# Patient Record
Sex: Male | Born: 1985 | Race: White | Hispanic: No | Marital: Married | Smoking: Former smoker
Health system: Southern US, Community
[De-identification: ages and names within clinical notes are randomized; demographics above are authoritative.]

## PROBLEM LIST (undated history)

## (undated) DIAGNOSIS — W57XXXA Bitten or stung by nonvenomous insect and other nonvenomous arthropods, initial encounter: Secondary | ICD-10-CM

## (undated) DIAGNOSIS — F909 Attention-deficit hyperactivity disorder, unspecified type: Secondary | ICD-10-CM

## (undated) DIAGNOSIS — L723 Sebaceous cyst: Secondary | ICD-10-CM

## (undated) DIAGNOSIS — G4730 Sleep apnea, unspecified: Secondary | ICD-10-CM

---
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
## 2005-06-28 HISTORY — PX: KNEE ARTHROSCOPY: SUR90

## 2005-12-08 ENCOUNTER — Emergency Department (HOSPITAL_COMMUNITY): Admission: EM | Admit: 2005-12-08 | Discharge: 2005-12-08 | Payer: Self-pay | Admitting: Emergency Medicine

## 2005-12-24 ENCOUNTER — Ambulatory Visit (HOSPITAL_BASED_OUTPATIENT_CLINIC_OR_DEPARTMENT_OTHER): Admission: RE | Admit: 2005-12-24 | Discharge: 2005-12-24 | Payer: Self-pay | Admitting: Orthopedic Surgery

## 2006-09-15 ENCOUNTER — Emergency Department (HOSPITAL_COMMUNITY): Admission: EM | Admit: 2006-09-15 | Discharge: 2006-09-15 | Payer: Self-pay | Admitting: Emergency Medicine

## 2011-12-15 ENCOUNTER — Inpatient Hospital Stay: Payer: Self-pay | Admitting: Internal Medicine

## 2011-12-15 LAB — CBC
HCT: 42 % (ref 40.0–52.0)
HGB: 14.1 g/dL (ref 13.0–18.0)
MCH: 29.7 pg (ref 26.0–34.0)
MCV: 89 fL (ref 80–100)
Platelet: 146 10*3/uL — ABNORMAL LOW (ref 150–440)
RDW: 12.9 % (ref 11.5–14.5)
WBC: 7.9 10*3/uL (ref 3.8–10.6)

## 2011-12-15 LAB — COMPREHENSIVE METABOLIC PANEL
Albumin: 3.3 g/dL — ABNORMAL LOW (ref 3.4–5.0)
Alkaline Phosphatase: 61 U/L (ref 50–136)
Anion Gap: 5 — ABNORMAL LOW (ref 7–16)
BUN: 16 mg/dL (ref 7–18)
Calcium, Total: 9.1 mg/dL (ref 8.5–10.1)
Co2: 30 mmol/L (ref 21–32)
Creatinine: 1.31 mg/dL — ABNORMAL HIGH (ref 0.60–1.30)
EGFR (Non-African Amer.): >60
Glucose: 96 mg/dL (ref 65–99)
Osmolality: 279 (ref 275–301)
Potassium: 3.8 mmol/L (ref 3.5–5.1)
SGOT(AST): 24 U/L (ref 15–37)
SGPT (ALT): 22 U/L
Total Protein: 6.9 g/dL (ref 6.4–8.2)

## 2011-12-15 LAB — DRUG SCREEN, URINE
Amphetamines, Ur Screen: NEGATIVE (ref ?–1000)
Barbiturates, Ur Screen: NEGATIVE (ref ?–200)
Benzodiazepine, Ur Scrn: NEGATIVE (ref ?–200)
Cocaine Metabolite,Ur ~~LOC~~: NEGATIVE (ref ?–300)
MDMA (Ecstasy)Ur Screen: NEGATIVE (ref ?–500)
Opiate, Ur Screen: NEGATIVE (ref ?–300)
Phencyclidine (PCP) Ur S: NEGATIVE (ref ?–25)

## 2011-12-15 LAB — URINALYSIS, COMPLETE
Bacteria: NONE SEEN
Bilirubin,UR: NEGATIVE
Ketone: NEGATIVE
Ph: 6 (ref 4.5–8.0)
RBC,UR: NONE SEEN /HPF (ref 0–5)
Specific Gravity: 1.02 (ref 1.003–1.030)
Squamous Epithelial: <1
WBC UR: 1 /HPF (ref 0–5)

## 2011-12-15 LAB — CK TOTAL AND CKMB (NOT AT ARMC)
CK-MB: 10.9 ng/mL — ABNORMAL HIGH (ref 0.5–3.6)
CK-MB: 27.5 ng/mL — ABNORMAL HIGH (ref 0.5–3.6)

## 2011-12-15 LAB — SEDIMENTATION RATE: Erythrocyte Sed Rate: 19 mm/hr — ABNORMAL HIGH (ref 0–15)

## 2011-12-15 LAB — LIPASE, BLOOD: Lipase: 97 U/L (ref 73–393)

## 2011-12-15 LAB — TROPONIN I
Troponin-I: 2.3 ng/mL — ABNORMAL HIGH
Troponin-I: 6.6 ng/mL — ABNORMAL HIGH

## 2011-12-16 LAB — BASIC METABOLIC PANEL
Calcium, Total: 8.6 mg/dL (ref 8.5–10.1)
Chloride: 105 mmol/L (ref 98–107)
Co2: 31 mmol/L (ref 21–32)
Creatinine: 1.22 mg/dL (ref 0.60–1.30)
Glucose: 109 mg/dL — ABNORMAL HIGH (ref 65–99)
Potassium: 4.1 mmol/L (ref 3.5–5.1)

## 2011-12-16 LAB — LIPID PANEL
Cholesterol: 109 mg/dL (ref 0–200)
HDL Cholesterol: 22 mg/dL — ABNORMAL LOW (ref 40–60)
Triglycerides: 107 mg/dL (ref 0–200)
VLDL Cholesterol, Calc: 21 mg/dL (ref 5–40)

## 2011-12-16 LAB — CBC WITH DIFFERENTIAL/PLATELET
Basophil #: 0 10*3/uL (ref 0.0–0.1)
Basophil %: 0.5 %
Eosinophil #: 0.2 10*3/uL (ref 0.0–0.7)
HCT: 38.6 % — ABNORMAL LOW (ref 40.0–52.0)
HGB: 12.6 g/dL — ABNORMAL LOW (ref 13.0–18.0)
Lymphocyte #: 2.6 10*3/uL (ref 1.0–3.6)
Lymphocyte %: 36.6 %
MCV: 89 fL (ref 80–100)
Monocyte %: 9.3 %
Neutrophil #: 3.6 10*3/uL (ref 1.4–6.5)
Platelet: 141 10*3/uL — ABNORMAL LOW (ref 150–440)
RBC: 4.33 10*6/uL — ABNORMAL LOW (ref 4.40–5.90)
WBC: 7.1 10*3/uL (ref 3.8–10.6)

## 2011-12-16 LAB — TROPONIN I: Troponin-I: 5.14 ng/mL — ABNORMAL HIGH

## 2011-12-16 LAB — CK TOTAL AND CKMB (NOT AT ARMC)
CK, Total: 200 U/L (ref 35–232)
CK-MB: 22.8 ng/mL — ABNORMAL HIGH (ref 0.5–3.6)

## 2012-01-27 ENCOUNTER — Encounter (INDEPENDENT_AMBULATORY_CARE_PROVIDER_SITE_OTHER): Payer: Self-pay | Admitting: General Surgery

## 2012-01-27 ENCOUNTER — Ambulatory Visit (INDEPENDENT_AMBULATORY_CARE_PROVIDER_SITE_OTHER): Payer: 59 | Admitting: General Surgery

## 2012-01-27 VITALS — BP 134/84 | HR 102 | Temp 98.4°F | Ht 75.0 in | Wt 304.8 lb

## 2012-01-27 DIAGNOSIS — L7211 Pilar cyst: Secondary | ICD-10-CM

## 2012-01-27 NOTE — Progress Notes (Signed)
Patient ID: Alfred Martinez, male   DOB: [birth year], 26 y.o.   MRN: [id number]  Chief Complaint  Patient presents with  . Pre-op Exam    eval seb cyst on back    HPI Alfred Martinez is a 26 y.o. male presents for evaluation of cyst on head. Previously evaluated 2 years ago, recommendation of surveillance for benign cyst. Has been present on left frontal scalp >6 years, very slow growth in size over that time. Has also noted a smaller similar lesion on left occipital area within the hair. He desires removal for mainly cosmetic reasons.   Denies any trauma, pain, redness, fluctuation in size, fevers, skin irritation.   HPI  History reviewed. No pertinent past medical history.  Past Surgical History  Procedure Date  . Joint replacement 12/2005    left knee    History reviewed. No pertinent family history.  Social History History  Substance Use Topics  . Smoking status: Never Smoker   . Smokeless tobacco: Not on file  . Alcohol Use: Yes     1-2x week    No Known Allergies  Current Outpatient Prescriptions  Medication Sig Dispense Refill  . amphetamine-dextroamphetamine (ADDERALL) 10 MG tablet         Review of Systems Review of Systems All other review of systems negative or noncontributory except as stated in the HPI  Blood pressure 134/84, pulse 102, temperature 98.4 F (36.9 C), temperature source Temporal, height 6\' 3"  (1.905 m), weight 304 lb 12.8 oz (138.256 kg), SpO2 98.00%.  Physical Exam Physical Exam Physical Exam  Vitals reviewed. Constitutional: He is oriented to person, place, and time. He appears well-developed and well-nourished. No distress.  HENT:  Head: Normocephalic and atraumatic. 2 cmm mobile cyst in left frontal-parietal region and small 4mm cyst on right posterior scalp. Mouth/Throat: No oropharyngeal exudate.  Eyes: Conjunctivae and EOM are normal. Pupils are equal, round, and reactive to light. Right eye exhibits no discharge. Left eye exhibits  no discharge. No scleral icterus.  Neck: Normal range of motion. No tracheal deviation present.  Cardiovascular: Normal rate, regular rhythm and normal heart sounds.   Pulmonary/Chest: Effort normal and breath sounds normal. No stridor. No respiratory distress. He has no wheezes. He has no rales. He exhibits no tenderness.  Abdominal: Soft. Bowel sounds are normal. He exhibits no distension and no mass. There is no tenderness. There is no rebound and no guarding.  Musculoskeletal: Normal range of motion. He exhibits no edema and no tenderness.  Neurological: He is alert and oriented to person, place, and time.  Skin: Skin is warm and dry. No rash noted. He is not diaphoretic. No erythema. No pallor.  Psychiatric: He has a normal mood and affect. His behavior is normal. Judgment and thought content normal.    Data Reviewed   Assessment    This is likely a benign pilar cyst This is most likely benign and has not really cause any symptoms or shown any significant growth. We discussed the risks and benefits of removal versus observation. He would like to think about this a lot longer and he can call me back if he would like to schedule this to be removed     Plan    Discussed with patient options including continued surveillance and cyst removal. This cyst removal procedure would be planned in OR, preferably under anesthesia due to risks of bleeding associated with the scalp. Discussed risks and benefits including bleeding, infection, recurrence, damage to surrounding structures. Patient desires  to discuss options and scheduling with his family and call if he desires to proceed.        Lodema Pilot DAVID 01/27/2012, 1:58 PM

## 2013-07-10 IMAGING — CT CT CHEST W/ CM
1 series · 15 of 33 positions shown, 19 images · IV contrast (APPLIED)
Comparison: None

REASON FOR EXAM: chest pain, elevated D-dimer
COMMENTS:

PROCEDURE:     CT  - CT CHEST (FOR PE) W  - December 15, 2011 [DATE]
RESULT:     Indications: Chest Pain
TECHNIQUE: A thin-section spiral CT from the lung apices to the upper
abdomen was acquired on a multi slice scanner following 100ml [id number]
intravenous contrast. These images were then transferred to the Siemens work
station and were subsequently reviewed utilizing 3-D reconstructions and MIP
images.

[Series 4: soft tissue · axial · 0.80mm/px · z∈[+596,+848]mm · 15 of 100 slices shown, 19 images]
[im 8/100  mediastinal]
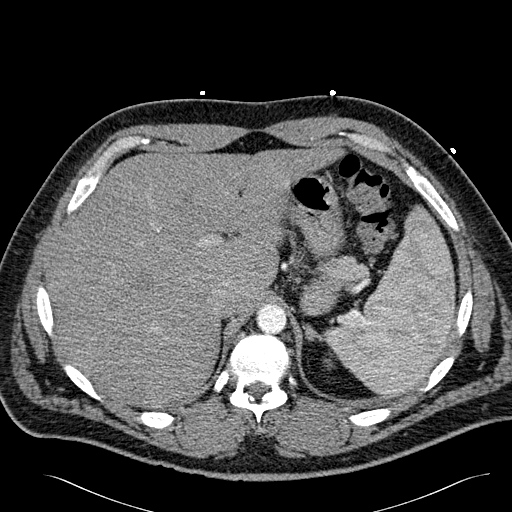
[im 8/100  lung]
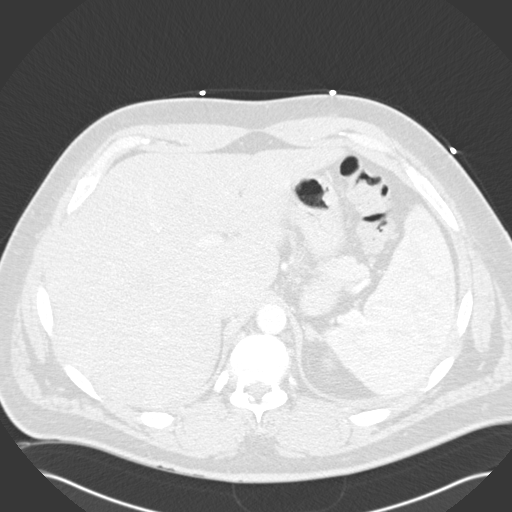
[im 15/100  lung]
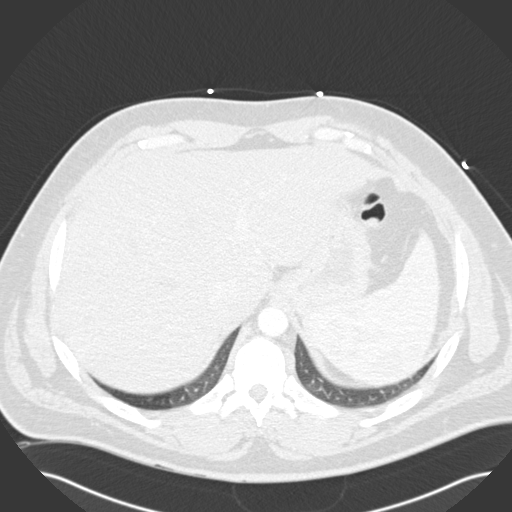
[im 20/100  lung]
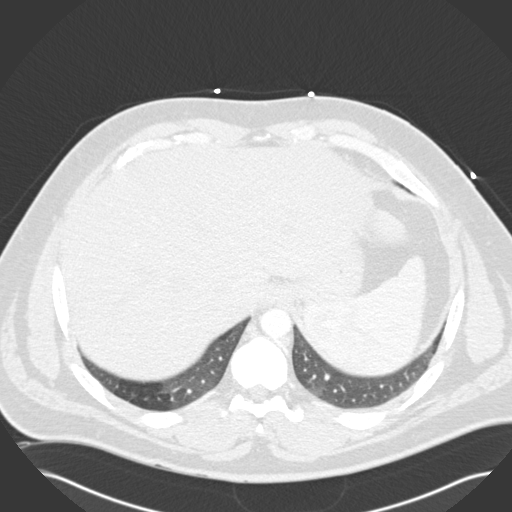
[im 26/100  lung]
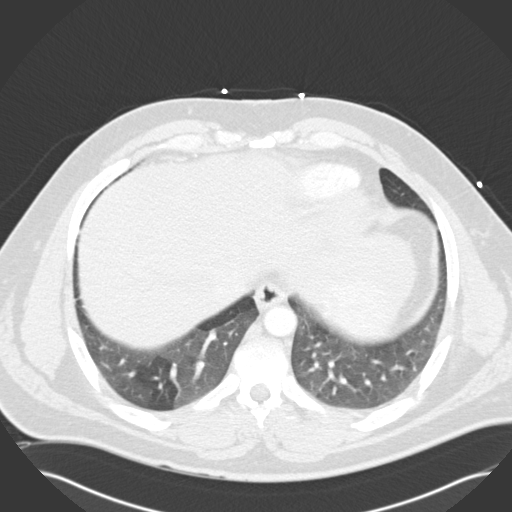
[im 34/100  mediastinal]
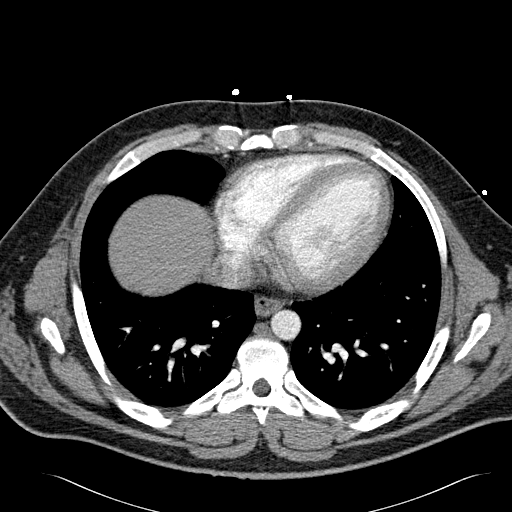
[im 34/100  lung]
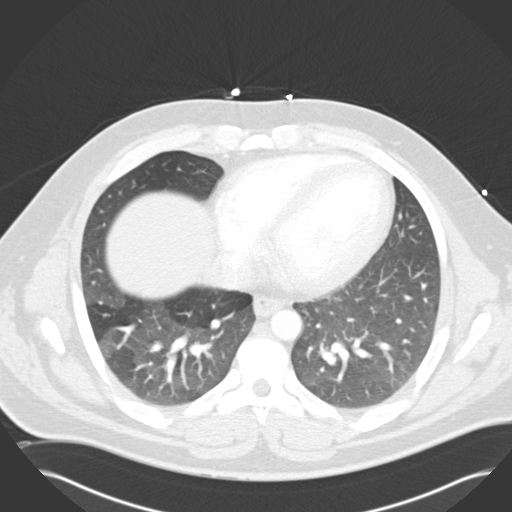
[im 40/100  lung]
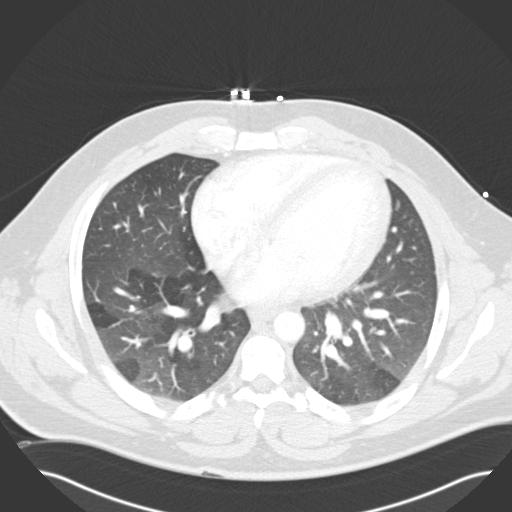
[im 45/100  lung]
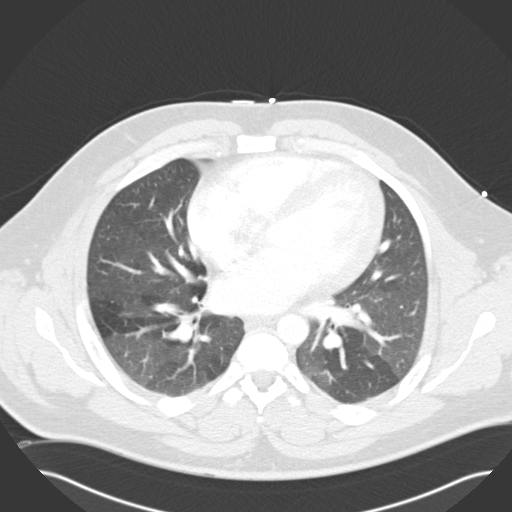
[im 52/100  lung]
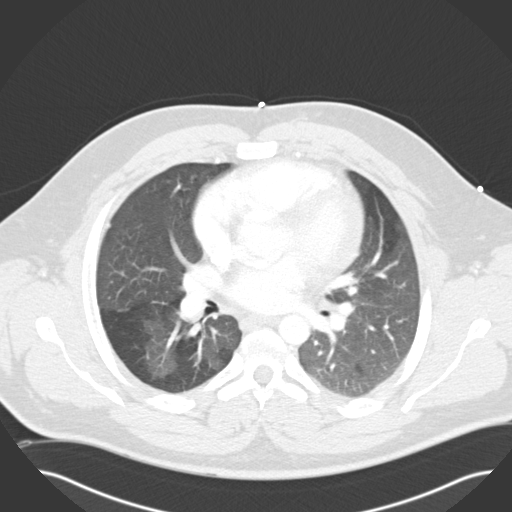
[im 56/100  mediastinal]
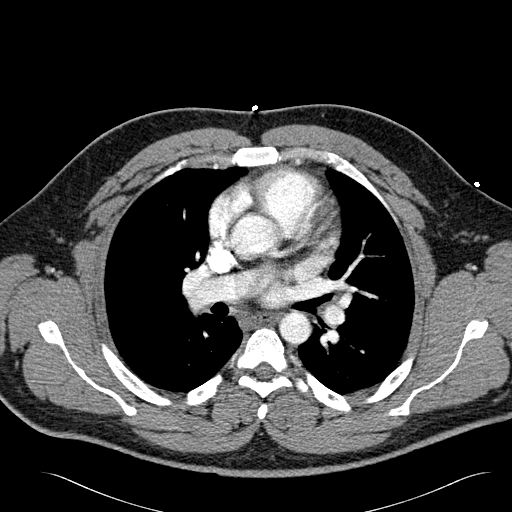
[im 56/100  lung]
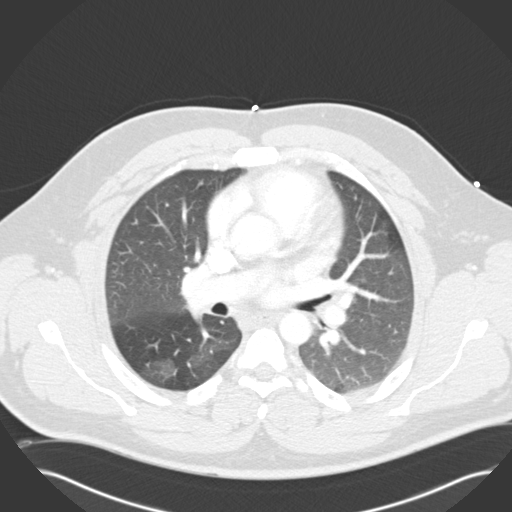
[im 60/100  lung]
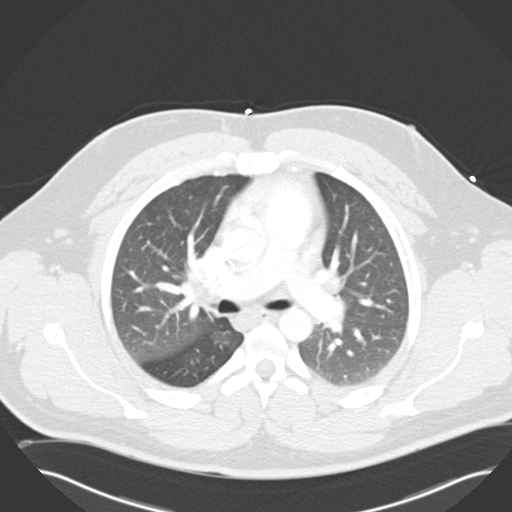
[im 67/100  lung]
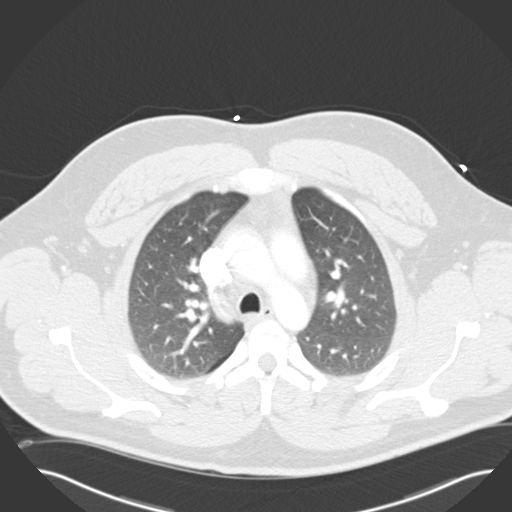
[im 74/100  lung]
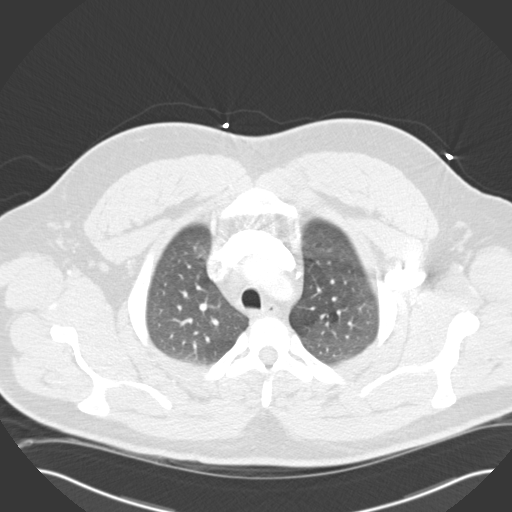
[im 80/100  mediastinal]
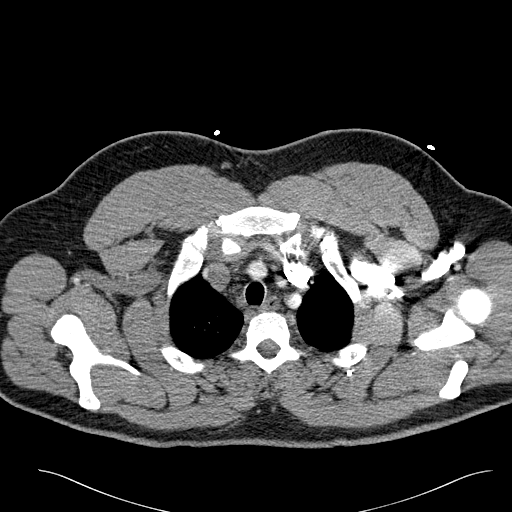
[im 80/100  lung]
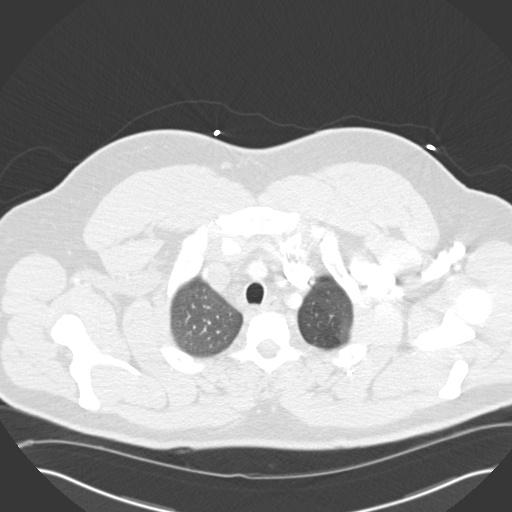
[im 85/100  lung]
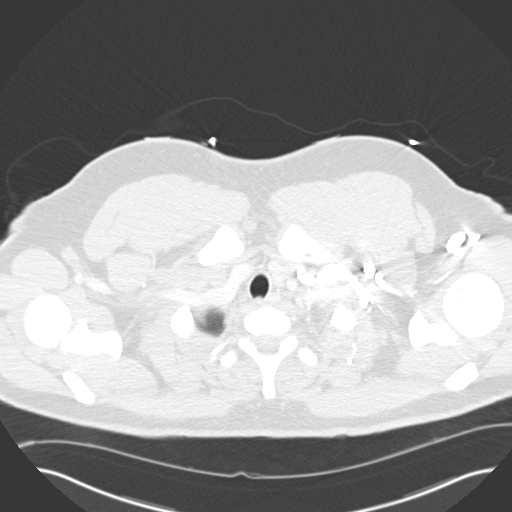
[im 92/100  lung]
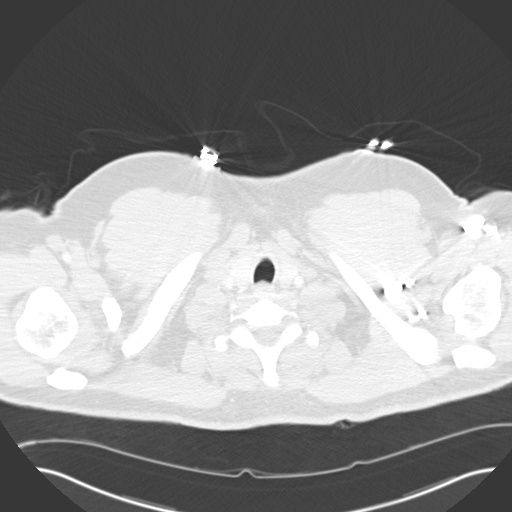

[15 of 33 positions shown; findings below may reference images not displayed]

FINDINGS: There is adequate opacification of the central pulmonary arteries. The
segmental and subsegmental branches are suboptimally opacified limiting
evaluation. There is no central pulmonary embolus. The main pulmonary
artery, right main pulmonary artery, and left main pulmonary arteries are
normal in size. The heart size is normal. There is no pericardial effusion.

The lungs are clear. There are areas of air-trapping in the lower lobes
bilaterally. There is no focal consolidation, pleural effusion, or
pneumothorax.

There is no axillary, hilar, or mediastinal adenopathy.

The osseous structures are unremarkable.

The visualized portions of the upper abdomen are unremarkable.
IMPRESSION: 1. There is no central pulmonary embolus. The segmental and subsegmental
branches are suboptimally opacified for evaluation.

2. There are patchy areas of air trapping in bilateral lower lobes.

[REDACTED]

## 2013-08-20 ENCOUNTER — Ambulatory Visit: Payer: BC Managed Care – PPO | Admitting: Family Medicine

## 2013-08-20 VITALS — BP 128/86 | HR 85 | Temp 98.0°F | Resp 16 | Ht 75.0 in | Wt 363.0 lb

## 2013-08-20 DIAGNOSIS — R1013 Epigastric pain: Secondary | ICD-10-CM

## 2013-08-20 DIAGNOSIS — K5289 Other specified noninfective gastroenteritis and colitis: Secondary | ICD-10-CM

## 2013-08-20 DIAGNOSIS — R197 Diarrhea, unspecified: Secondary | ICD-10-CM

## 2013-08-20 LAB — POCT CBC
Granulocyte percent: 59 % (ref 37–80)
HCT, POC: 45.2 % (ref 43.5–53.7)
Hemoglobin: 14.6 g/dL (ref 14.1–18.1)
Lymph, poc: 2.3 (ref 0.6–3.4)
MCH, POC: 28.9 pg (ref 27–31.2)
MCHC: 32.3 g/dL (ref 31.8–35.4)
MCV: 89.3 fL (ref 80–97)
MID (cbc): 0.5 (ref 0–0.9)
MPV: 8 fL (ref 0–99.8)
POC Granulocyte: 4 (ref 2–6.9)
POC LYMPH PERCENT: 33.6 %L (ref 10–50)
POC MID %: 7.4 % (ref 0–12)
Platelet Count, POC: 215 10*3/uL (ref 142–424)
RBC: 5.06 M/uL (ref 4.69–6.13)
RDW, POC: 12.8 %
WBC: 6.8 10*3/uL (ref 4.6–10.2)

## 2013-08-20 LAB — COMPREHENSIVE METABOLIC PANEL
ALT: 32 U/L (ref 0–53)
AST: 16 U/L (ref 0–37)
Albumin: 4.1 g/dL (ref 3.5–5.2)
BUN: 14 mg/dL (ref 6–23)
CO2: 24 mEq/L (ref 19–32)
Calcium: 8.7 mg/dL (ref 8.4–10.5)
Chloride: 105 mEq/L (ref 96–112)
Creat: 0.88 mg/dL (ref 0.50–1.35)
Glucose, Bld: 95 mg/dL (ref 70–99)
Sodium: 138 mEq/L (ref 135–145)
Total Bilirubin: 0.4 mg/dL (ref 0.2–1.2)
Total Protein: 6.1 g/dL (ref 6.0–8.3)

## 2013-08-20 LAB — COMPREHENSIVE METABOLIC PANEL WITH GFR
Alkaline Phosphatase: 54 U/L (ref 39–117)
Potassium: 4.1 meq/L (ref 3.5–5.3)

## 2013-08-20 NOTE — Progress Notes (Signed)
Chief Complaint:  Chief Complaint  Patient presents with  . Diarrhea    1 week  . Gastrophageal Reflux  . Nausea    HPI: Alfred Martinez is a 28 y.o. male who is here for 1 week history of nonbloody diarrhea, yesterday he had 9 episodes, he did not eat a whole lot. He has been drinking water and unsweetened tea, once every 30 min-1 hour. He denies nausea or vomiting; denies o fevers, chills, rashes. He has had a friend with a stomach virus. His friend got a shot and went away in a day or so. He does not remember eating out or anything different or going out or taking any new meds. He has not felt terrible, he has not had lots of enerygy. Still able to take in fluids. 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 History of GERD. He has not had nausea, he made himself throw up after trying otc meds. He denies abd pain, denies a history of constipation, does not have to strain.  He had a cold 2 weeks but not similar sxs. Just sinus congestion and cold. Has taken immodium and peptobsimol. He was getting bad indigestion so made himself vomit x 1.    No past medical history on file. Past Surgical History  Procedure Laterality Date  . Joint replacement  12/2005    left knee   History   Social History  . Marital Status: Single    Spouse Name: N/A    Number of Children: N/A  . Years of Education: N/A   Social History Main Topics  . Smoking status: Never Smoker   . Smokeless tobacco: None  . Alcohol Use: Yes     Comment: 1-2x week  . Drug Use: No  . Sexual Activity:    Other Topics Concern  . None   Social History Narrative  . None   No family history on file. No Known Allergies Prior to Admission medications   Medication Sig Start Date End Date Taking? Authorizing Provider  amphetamine-dextroamphetamine (ADDERALL) 10 MG tablet  01/25/12  Yes Historical Provider, MD     ROS: The patient denies fevers, chills,  night sweats, unintentional weight loss, chest pain, palpitations, wheezing, dyspnea on exertion, dysuria, hematuria, melena, numbness, weakness, or tingling.   All other systems have been reviewed and were otherwise negative with the exception of those mentioned in the HPI and as above.    PHYSICAL EXAM: Filed Vitals:   08/20/13 0916  BP: 128/86  Pulse: 85  Temp: 98 F (36.7 C)  Resp: 16   Filed Vitals:   08/20/13 0916  Height: 6\' 3"  (1.905 m)  Weight: 363 lb (164.656 kg)   Body mass index is 45.37 kg/(m^2).  General: Alert, no acute distress HEENT:  Normocephalic, atraumatic, oropharynx patent. EOMI, PERRLA, erythematous throat.  Cardiovascular:  Regular rate and rhythm, no rubs murmurs or gallops.  No Carotid bruits, radial pulse intact. No pedal edema.  Respiratory: Clear to auscultation bilaterally.  No wheezes, rales, or rhonchi.  No cyanosis, no use of accessory musculature GI: No organomegaly, abdomen is soft and non-tender, positive bowel sounds.  No masses. Skin: No rashes. Neurologic: Facial musculature symmetric. Psychiatric: Patient is appropriate throughout our interaction. Lymphatic: No cervical lymphadenopathy Musculoskeletal: Gait intact.   LABS: Results for orders placed in visit on 08/20/13  POCT CBC      Result Value Ref Range  WBC 6.8  4.6 - 10.2 K/uL   Lymph, poc 2.3  0.6 - 3.4   POC LYMPH PERCENT 33.6  10 - 50 %L   MID (cbc) 0.5  0 - 0.9   POC MID % 7.4  0 - 12 %M   POC Granulocyte 4.0  2 - 6.9   Granulocyte percent 59.0  37 - 80 %G   RBC 5.06  4.69 - 6.13 M/uL   Hemoglobin 14.6  14.1 - 18.1 g/dL   HCT, POC 40.9  81.1 - 53.7 %   MCV 89.3  80 - 97 fL   MCH, POC 28.9  27 - 31.2 pg   MCHC 32.3  31.8 - 35.4 g/dL   RDW, POC 91.4     Platelet Count, POC 215  142 - 424 K/uL   MPV 8.0  0 - 99.8 fL     EKG/XRAY:   Primary read interpreted by Dr. Conley Rolls at [facility].   ASSESSMENT/PLAN: Encounter Diagnoses  Name Primary?  . Abdominal pain, epigastric  Yes  . Diarrhea   . Other and unspecified noninfectious gastroenteritis and colitis(558.9)    Possible viral GI bug with GERD sxs PPI x BID Push fluids, BRAT diet CMP , h pylori, stool cx and op pending F/u prn   Gross sideeffects, risk and benefits, and alternatives of medications d/w patient. Patient is aware that all medications have potential sideeffects and we are unable to predict every sideeffect or drug-drug interaction that may occur.  Hamilton Capri PHUONG, DO 08/20/2013 10:33 AM

## 2013-08-20 NOTE — Patient Instructions (Signed)
Diet for Diarrhea, Adult  Frequent, runny stools (diarrhea) may be caused or worsened by food or drink. Diarrhea may be relieved by changing your diet. Since diarrhea can last up to 7 days, it is easy for you to lose too much fluid from the body and become dehydrated. Fluids that are lost need to be replaced. Along with a modified diet, make sure you drink enough fluids to keep your urine clear or pale yellow.  DIET INSTRUCTIONS  · Ensure adequate fluid intake (hydration): have 1 cup (8 oz) of fluid for each diarrhea episode. Avoid fluids that contain simple sugars or sports drinks, fruit juices, whole milk products, and sodas. Your urine should be clear or pale yellow if you are drinking enough fluids. Hydrate with an oral rehydration solution that you can purchase at pharmacies, retail stores, and online. You can prepare an oral rehydration solution at home by mixing the following ingredients together:  ·   tsp table salt.  · ¾ tsp baking soda.  ·  tsp salt substitute containing potassium chloride.  · 1  tablespoons sugar.  · 1 L (34 oz) of water.  · Certain foods and beverages may increase the speed at which food moves through the gastrointestinal (GI) tract. These foods and beverages should be avoided and include:  · Caffeinated and alcoholic beverages.  · High-fiber foods, such as raw fruits and vegetables, nuts, seeds, and whole grain breads and cereals.  · Foods and beverages sweetened with sugar alcohols, such as xylitol, sorbitol, and mannitol.  · Some foods may be well tolerated and may help thicken stool including:  · Starchy foods, such as rice, toast, pasta, low-sugar cereal, oatmeal, grits, baked potatoes, crackers, and bagels.    · Bananas.    · Applesauce.  · Add probiotic-rich foods to help increase healthy bacteria in the GI tract, such as yogurt and fermented milk products.  RECOMMENDED FOODS AND BEVERAGES  Starches  Choose foods with less than 2 g of fiber per serving.  · Recommended:  White,  French, and pita breads, plain rolls, buns, bagels. Plain muffins, matzo. Soda, saltine, or graham crackers. Pretzels, melba toast, zwieback. Cooked cereals made with water: cornmeal, farina, cream cereals. Dry cereals: refined corn, wheat, rice. Potatoes prepared any way without skins, refined macaroni, spaghetti, noodles, refined rice.  · Avoid:  Bread, rolls, or crackers made with whole wheat, multi-grains, rye, bran seeds, nuts, or coconut. Corn tortillas or taco shells. Cereals containing whole grains, multi-grains, bran, coconut, nuts, raisins. Cooked or dry oatmeal. Coarse wheat cereals, granola. Cereals advertised as "high-fiber." Potato skins. Whole grain pasta, wild or brown rice. Popcorn. Sweet potatoes, yams. Sweet rolls, doughnuts, waffles, pancakes, sweet breads.  Vegetables  · Recommended: Strained tomato and vegetable juices. Most well-cooked and canned vegetables without seeds. Fresh: Tender lettuce, cucumber without the skin, cabbage, spinach, bean sprouts.  · Avoid: Fresh, cooked, or canned: Artichokes, baked beans, beet greens, broccoli, Brussels sprouts, corn, kale, legumes, peas, sweet potatoes. Cooked: Green or red cabbage, spinach. Avoid large servings of any vegetables because vegetables shrink when cooked, and they contain more fiber per serving than fresh vegetables.  Fruit  · Recommended: Cooked or canned: Apricots, applesauce, cantaloupe, cherries, fruit cocktail, grapefruit, grapes, kiwi, mandarin oranges, peaches, pears, plums, watermelon. Fresh: Apples without skin, ripe banana, grapes, cantaloupe, cherries, grapefruit, peaches, oranges, plums. Keep servings limited to ½ cup or 1 piece.  · Avoid: Fresh: Apples with skin, apricots, mangoes, pears, raspberries, strawberries. Prune juice, stewed or dried prunes. Dried   fruits, raisins, dates. Large servings of all fresh fruits.  Protein  · Recommended: Ground or well-cooked tender beef, ham, veal, lamb, pork, or poultry. Eggs. Fish,  oysters, shrimp, lobster, other seafoods. Liver, organ meats.  · Avoid: Tough, fibrous meats with gristle. Peanut butter, smooth or chunky. Cheese, nuts, seeds, legumes, dried peas, beans, lentils.  Dairy  · Recommended: Yogurt, lactose-free milk, kefir, drinkable yogurt, buttermilk, soy milk, or plain hard cheese.  · Avoid: Milk, chocolate milk, beverages made with milk, such as milkshakes.  Soups  · Recommended: Bouillon, broth, or soups made from allowed foods. Any strained soup.  · Avoid: Soups made from vegetables that are not allowed, cream or milk-based soups.  Desserts and Sweets  · Recommended: Sugar-free gelatin, sugar-free frozen ice pops made without sugar alcohol.  · Avoid: Plain cakes and cookies, pie made with fruit, pudding, custard, cream pie. Gelatin, fruit, ice, sherbet, frozen ice pops. Ice cream, ice milk without nuts. Plain hard candy, honey, jelly, molasses, syrup, sugar, chocolate syrup, gumdrops, marshmallows.  Fats and Oils  · Recommended: Limit fats to less than 8 tsp per day.  · Avoid: Seeds, nuts, olives, avocados. Margarine, butter, cream, mayonnaise, salad oils, plain salad dressings. Plain gravy, crisp bacon without rind.  Beverages  · Recommended: Water, decaffeinated teas, oral rehydration solutions, sugar-free beverages not sweetened with sugar alcohols.  · Avoid: Fruit juices, caffeinated beverages (coffee, tea, soda), alcohol, sports drinks, or lemon-lime soda.  Condiments  · Recommended: Ketchup, mustard, horseradish, vinegar, cocoa powder. Spices in moderation: allspice, basil, bay leaves, celery powder or leaves, cinnamon, cumin powder, curry powder, ginger, mace, marjoram, onion or garlic powder, oregano, paprika, parsley flakes, ground pepper, rosemary, sage, savory, tarragon, thyme, turmeric.  · Avoid: Coconut, honey.  Document Released: 09/04/2003 Document Revised: 03/08/2012 Document Reviewed: 10/29/2011  [employer]® Patient Information ©2014 [employer].

## 2013-08-22 ENCOUNTER — Telehealth: Payer: Self-pay | Admitting: Family Medicine

## 2013-08-22 LAB — HELICOBACTER PYLORI  ANTIBODY, IGM: Helicobacter pylori, IgM: 0.7 U/mL (ref <9.0)

## 2013-08-22 NOTE — Telephone Encounter (Signed)
Spoke with patient about labs, he is feeling 100%. Did not give stool sample, told him it was ok to not give me any.

## 2013-11-02 ENCOUNTER — Encounter (INDEPENDENT_AMBULATORY_CARE_PROVIDER_SITE_OTHER): Payer: 59 | Admitting: Surgery

## 2013-11-07 ENCOUNTER — Encounter (INDEPENDENT_AMBULATORY_CARE_PROVIDER_SITE_OTHER): Payer: Self-pay | Admitting: General Surgery

## 2013-11-07 ENCOUNTER — Ambulatory Visit (INDEPENDENT_AMBULATORY_CARE_PROVIDER_SITE_OTHER): Payer: BC Managed Care – PPO | Admitting: General Surgery

## 2013-11-07 ENCOUNTER — Telehealth (INDEPENDENT_AMBULATORY_CARE_PROVIDER_SITE_OTHER): Payer: Self-pay | Admitting: General Surgery

## 2013-11-07 DIAGNOSIS — L723 Sebaceous cyst: Secondary | ICD-10-CM

## 2013-11-07 NOTE — Progress Notes (Signed)
Patient ID: Alfred Martinez, male   DOB: 05/26/1986, 28 y.o.   MRN: [id number]  Chief Complaint  Patient presents with  . New Evaluation    seba cyst on top and back of head     HPI Alfred Evahomas Sartin is a 28 y.o. male.  Chief complaint: Scalp sebaceous cyst HPI Patient was previously seen by Dr. Biagio QuintLayton regarding a sebaceous cyst on his scalp. This is a left side towards the front. He did not schedule surgery but, since that time, has changed his mind and presents for evaluation for removal. It has never become infected. It has never drained. In the interim, he is also found another small cyst in the posterior right scalp which he would like removed. History reviewed. No pertinent past medical history.  Past Surgical History  Procedure Laterality Date  . 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 No pertinent family history.  Social History History  Substance Use Topics  . Smoking status: Former Smoker    Quit date: 11/08/2002  . Smokeless tobacco: Not on file  . Alcohol Use: Yes     Comment: 1-2x week    No Known Allergies  Current Outpatient Prescriptions  Medication Sig Dispense Refill  . amphetamine-dextroamphetamine (ADDERALL) 10 MG tablet       . Cyanocobalamin (B-12) 100 MCG TABS Take 200 capsules by mouth.      . Multiple Vitamins-Minerals (ECHINACEA ACZ PO) Take by mouth.      . vitamin C (ASCORBIC ACID) 500 MG tablet Take 500 mg by mouth.       No current facility-administered medications for this visit.    Review of Systems Review of Systems  Constitutional: Negative for fever, chills and unexpected weight change.  HENT: Negative for congestion, hearing loss, sore throat, trouble swallowing and voice change.   Eyes: Negative for visual disturbance.  Respiratory: Negative for cough and wheezing.   Cardiovascular: Negative for chest pain, palpitations and leg swelling.  Gastrointestinal: Negative for nausea, vomiting, abdominal pain, diarrhea,  constipation, blood in stool, abdominal distention, anal bleeding and rectal pain.  Genitourinary: Negative for hematuria and difficulty urinating.  Musculoskeletal: Negative for arthralgias.  Skin: Negative for rash and wound.       Please refer to history of present illness  Neurological: Negative for seizures, syncope, weakness and headaches.  Hematological: Negative for adenopathy. Does not bruise/bleed easily.  Psychiatric/Behavioral: Negative for confusion.    There were no vitals taken for this visit.  Physical Exam Physical Exam  Constitutional: He is oriented to person, place, and time. He appears well-developed and well-nourished.  HENT:  Head:    2 cm sebaceous cyst left frontal scalp without evidence of infection, 8mm cyst right occipital scalp without evidence of infection  Eyes: EOM are normal. Pupils are equal, round, and reactive to light.  Neck: Normal range of motion. Neck supple. No tracheal deviation present.  Cardiovascular: Normal rate and normal heart sounds.   Pulmonary/Chest: Effort normal and breath sounds normal. No stridor. No respiratory distress. He has no wheezes.  Abdominal: Soft. Bowel sounds are normal. He exhibits no distension. There is no tenderness. There is no rebound.  Musculoskeletal: Normal range of motion.  Lymphadenopathy:    He has no cervical adenopathy.  Neurological: He is alert and oriented to person, place, and time.  Skin: Skin is warm and dry.     Assessment    Sebaceous cyst of scalp x2    Plan  I have offered removal as an outpatient surgical procedure. We will remove both cysts. Procedure, risks, benefits were discussed in detail the patient. He plans to schedule in the future. He is agreeable.       Liz MaladyBurke E Tyse Auriemma 11/07/2013, 12:21 PM

## 2013-11-07 NOTE — Addendum Note (Signed)
Addended by: Liz MaladyHOMPSON, Artelia Game E on: 11/07/2013 12:26 PM   Modules accepted: Orders

## 2013-11-07 NOTE — Telephone Encounter (Signed)
Patient met with surgery scheduling went over financial responsibilities, patient will call back to schedule. °

## 2013-11-26 ENCOUNTER — Encounter (HOSPITAL_BASED_OUTPATIENT_CLINIC_OR_DEPARTMENT_OTHER): Payer: Self-pay | Admitting: *Deleted

## 2013-11-26 DIAGNOSIS — L723 Sebaceous cyst: Secondary | ICD-10-CM

## 2013-11-26 DIAGNOSIS — W57XXXA Bitten or stung by nonvenomous insect and other nonvenomous arthropods, initial encounter: Secondary | ICD-10-CM

## 2013-11-26 HISTORY — DX: Bitten or stung by nonvenomous insect and other nonvenomous arthropods, initial encounter: W57.XXXA

## 2013-11-26 HISTORY — DX: Sebaceous cyst: L72.3

## 2013-11-26 NOTE — Pre-Procedure Instructions (Signed)
BMI and hx. of sleep apnea discussed with Dr. Jean Rosenthal; pt. OK to come for surgery.

## 2013-11-30 ENCOUNTER — Ambulatory Visit (HOSPITAL_BASED_OUTPATIENT_CLINIC_OR_DEPARTMENT_OTHER): Payer: BC Managed Care – PPO | Admitting: Certified Registered"

## 2013-11-30 ENCOUNTER — Encounter (HOSPITAL_BASED_OUTPATIENT_CLINIC_OR_DEPARTMENT_OTHER): Payer: BC Managed Care – PPO | Admitting: Certified Registered"

## 2013-11-30 ENCOUNTER — Encounter (HOSPITAL_BASED_OUTPATIENT_CLINIC_OR_DEPARTMENT_OTHER): Payer: Self-pay | Admitting: *Deleted

## 2013-11-30 ENCOUNTER — Encounter (HOSPITAL_BASED_OUTPATIENT_CLINIC_OR_DEPARTMENT_OTHER)
Admission: RE | Disposition: A | Discharge status: Home / Self Care | Payer: Self-pay | Source: Ambulatory Visit | Attending: General Surgery

## 2013-11-30 ENCOUNTER — Ambulatory Visit (HOSPITAL_BASED_OUTPATIENT_CLINIC_OR_DEPARTMENT_OTHER)
Admission: RE | Admit: 2013-11-30 | Discharge: 2013-11-30 | Disposition: A | Discharge status: Home / Self Care | Payer: BC Managed Care – PPO | Source: Ambulatory Visit | Attending: General Surgery | Admitting: General Surgery

## 2013-11-30 DIAGNOSIS — G4730 Sleep apnea, unspecified: Secondary | ICD-10-CM | POA: Insufficient documentation

## 2013-11-30 DIAGNOSIS — Z96659 Presence of unspecified artificial knee joint: Secondary | ICD-10-CM | POA: Insufficient documentation

## 2013-11-30 DIAGNOSIS — Z87891 Personal history of nicotine dependence: Secondary | ICD-10-CM | POA: Insufficient documentation

## 2013-11-30 DIAGNOSIS — Z79899 Other long term (current) drug therapy: Secondary | ICD-10-CM | POA: Insufficient documentation

## 2013-11-30 DIAGNOSIS — F909 Attention-deficit hyperactivity disorder, unspecified type: Secondary | ICD-10-CM | POA: Insufficient documentation

## 2013-11-30 DIAGNOSIS — L7212 Trichodermal cyst: Secondary | ICD-10-CM | POA: Insufficient documentation

## 2013-11-30 DIAGNOSIS — L723 Sebaceous cyst: Secondary | ICD-10-CM

## 2013-11-30 HISTORY — DX: Sleep apnea, unspecified: G47.30

## 2013-11-30 HISTORY — DX: Sebaceous cyst: L72.3

## 2013-11-30 HISTORY — DX: Bitten or stung by nonvenomous insect and other nonvenomous arthropods, initial encounter: W57.XXXA

## 2013-11-30 HISTORY — DX: Attention-deficit hyperactivity disorder, unspecified type: F90.9

## 2013-11-30 HISTORY — PX: EAR CYST EXCISION: SHX22

## 2013-11-30 LAB — POCT HEMOGLOBIN-HEMACUE: Hemoglobin: 14.4 g/dL (ref 13.0–17.0)

## 2013-11-30 SURGERY — CYST REMOVAL
Anesthesia: General | Site: Scalp

## 2013-11-30 MED ORDER — MIDAZOLAM HCL 2 MG/ML PO SYRP: 12.0000 mg | ORAL_SOLUTION | Freq: Once | ORAL | Status: DC | PRN

## 2013-11-30 MED ORDER — CHLORHEXIDINE GLUCONATE 4 % EX LIQD: 1.0000 "application " | Freq: Once | CUTANEOUS | Status: DC

## 2013-11-30 MED ORDER — HYDROMORPHONE HCL PF 1 MG/ML IJ SOLN
0.2500 mg | INTRAMUSCULAR | Status: DC | PRN
  Administered 2013-11-30: 0.25 mg via INTRAVENOUS
  Administered 2013-11-30: 0.5 mg via INTRAVENOUS

## 2013-11-30 MED ORDER — DEXAMETHASONE SODIUM PHOSPHATE 4 MG/ML IJ SOLN
INTRAMUSCULAR | Status: DC | PRN
  Administered 2013-11-30: 10 mg via INTRAVENOUS

## 2013-11-30 MED ORDER — OXYCODONE HCL 5 MG PO TABS: 5.0000 mg | ORAL_TABLET | ORAL | Status: AC | PRN

## 2013-11-30 MED ORDER — BUPIVACAINE HCL (PF) 0.25 % IJ SOLN
INTRAMUSCULAR | Status: AC
Start: 2013-11-30 — End: 2013-11-30
  Filled 2013-11-30: qty 30

## 2013-11-30 MED ORDER — FENTANYL CITRATE 0.05 MG/ML IJ SOLN: 50.0000 ug | INTRAMUSCULAR | Status: DC | PRN

## 2013-11-30 MED ORDER — MIDAZOLAM HCL 5 MG/5ML IJ SOLN
INTRAMUSCULAR | Status: DC | PRN
  Administered 2013-11-30: 2 mg via INTRAVENOUS

## 2013-11-30 MED ORDER — SUCCINYLCHOLINE CHLORIDE 20 MG/ML IJ SOLN
INTRAMUSCULAR | Status: DC | PRN
  Administered 2013-11-30: 100 mg via INTRAVENOUS

## 2013-11-30 MED ORDER — OXYCODONE HCL 5 MG PO TABS
ORAL_TABLET | ORAL | Status: AC
  Filled 2013-11-30: qty 1

## 2013-11-30 MED ORDER — LACTATED RINGERS IV SOLN
INTRAVENOUS | Status: DC
  Administered 2013-11-30 (×2): via INTRAVENOUS

## 2013-11-30 MED ORDER — DEXTROSE 5 % IV SOLN
3.0000 g | INTRAVENOUS | Status: AC
  Administered 2013-11-30: 3 g via INTRAVENOUS

## 2013-11-30 MED ORDER — MIDAZOLAM HCL 2 MG/2ML IJ SOLN: 1.0000 mg | INTRAMUSCULAR | Status: DC | PRN

## 2013-11-30 MED ORDER — MIDAZOLAM HCL 2 MG/2ML IJ SOLN
INTRAMUSCULAR | Status: AC
  Filled 2013-11-30: qty 2

## 2013-11-30 MED ORDER — BUPIVACAINE-EPINEPHRINE (PF) 0.5% -1:200000 IJ SOLN
INTRAMUSCULAR | Status: AC
  Filled 2013-11-30: qty 30

## 2013-11-30 MED ORDER — PROPOFOL 10 MG/ML IV BOLUS
INTRAVENOUS | Status: DC | PRN
  Administered 2013-11-30: 250 mg via INTRAVENOUS

## 2013-11-30 MED ORDER — BUPIVACAINE-EPINEPHRINE 0.5% -1:200000 IJ SOLN
INTRAMUSCULAR | Status: DC | PRN
  Administered 2013-11-30: 11 mL

## 2013-11-30 MED ORDER — FENTANYL CITRATE 0.05 MG/ML IJ SOLN
INTRAMUSCULAR | Status: AC
  Filled 2013-11-30: qty 6

## 2013-11-30 MED ORDER — ONDANSETRON HCL 4 MG/2ML IJ SOLN
INTRAMUSCULAR | Status: DC | PRN
  Administered 2013-11-30: 4 mg via INTRAVENOUS

## 2013-11-30 MED ORDER — SUCCINYLCHOLINE CHLORIDE 20 MG/ML IJ SOLN
INTRAMUSCULAR | Status: AC
  Filled 2013-11-30: qty 1

## 2013-11-30 MED ORDER — CEFAZOLIN SODIUM 1-5 GM-% IV SOLN
INTRAVENOUS | Status: AC
  Filled 2013-11-30: qty 50

## 2013-11-30 MED ORDER — LIDOCAINE HCL (CARDIAC) 20 MG/ML IV SOLN
INTRAVENOUS | Status: DC | PRN
Start: 2013-11-30 — End: 2013-11-30
  Administered 2013-11-30: 60 mg via INTRAVENOUS

## 2013-11-30 MED ORDER — OXYCODONE HCL 5 MG/5ML PO SOLN: 5.0000 mg | Freq: Once | ORAL | Status: AC | PRN

## 2013-11-30 MED ORDER — HYDROMORPHONE HCL PF 1 MG/ML IJ SOLN
INTRAMUSCULAR | Status: AC
  Filled 2013-11-30: qty 1

## 2013-11-30 MED ORDER — CEFAZOLIN SODIUM-DEXTROSE 2-3 GM-% IV SOLR
INTRAVENOUS | Status: AC
  Filled 2013-11-30: qty 50

## 2013-11-30 MED ORDER — FENTANYL CITRATE 0.05 MG/ML IJ SOLN
INTRAMUSCULAR | Status: DC | PRN
  Administered 2013-11-30: 50 ug via INTRAVENOUS
  Administered 2013-11-30: 100 ug via INTRAVENOUS

## 2013-11-30 MED ORDER — OXYCODONE HCL 5 MG PO TABS
5.0000 mg | ORAL_TABLET | Freq: Once | ORAL | Status: AC | PRN
  Administered 2013-11-30: 5 mg via ORAL

## 2013-11-30 MED ORDER — PROPOFOL 10 MG/ML IV EMUL
INTRAVENOUS | Status: AC
  Filled 2013-11-30: qty 100

## 2013-11-30 SURGICAL SUPPLY — 57 items
ADH SKN CLS APL DERMABOND .7 (GAUZE/BANDAGES/DRESSINGS) ×2
APL SKNCLS STERI-STRIP NONHPOA (GAUZE/BANDAGES/DRESSINGS)
BENZOIN TINCTURE PRP APPL 2/3 (GAUZE/BANDAGES/DRESSINGS) IMPLANT
BLADE 15 SAFETY STRL DISP (BLADE) ×1 IMPLANT
BLADE HEX COATED 2.75 (ELECTRODE) ×2 IMPLANT
BLADE SURG 15 STRL LF DISP TIS (BLADE) IMPLANT
BLADE SURG 15 STRL SS (BLADE) ×2
BLADE SURG ROTATE 9660 (MISCELLANEOUS) ×1 IMPLANT
CANISTER SUCT 1200ML W/VALVE (MISCELLANEOUS) ×1 IMPLANT
CHLORAPREP W/TINT 26ML (MISCELLANEOUS) ×3 IMPLANT
COVER MAYO STAND STRL (DRAPES) ×2 IMPLANT
COVER TABLE BACK 60X90 (DRAPES) ×2 IMPLANT
DECANTER SPIKE VIAL GLASS SM (MISCELLANEOUS) ×1 IMPLANT
DERMABOND ADVANCED (GAUZE/BANDAGES/DRESSINGS) ×2
DERMABOND ADVANCED .7 DNX12 (GAUZE/BANDAGES/DRESSINGS) IMPLANT
DRAPE PED LAPAROTOMY (DRAPES) ×2 IMPLANT
DRAPE U-SHAPE 76X120 STRL (DRAPES) ×1 IMPLANT
DRAPE UTILITY XL STRL (DRAPES) ×2 IMPLANT
ELECT REM PT RETURN 9FT ADLT (ELECTROSURGICAL) ×2
ELECTRODE REM PT RTRN 9FT ADLT (ELECTROSURGICAL) ×1 IMPLANT
GAUZE SPONGE 4X4 12PLY STRL (GAUZE/BANDAGES/DRESSINGS) IMPLANT
GLOVE BIO SURGEON STRL SZ8 (GLOVE) ×3 IMPLANT
GLOVE BIOGEL PI IND STRL 8 (GLOVE) ×1 IMPLANT
GLOVE BIOGEL PI INDICATOR 8 (GLOVE) ×2
GLOVE ECLIPSE 6.5 STRL STRAW (GLOVE) ×1 IMPLANT
GOWN STRL REUS W/ TWL LRG LVL3 (GOWN DISPOSABLE) ×1 IMPLANT
GOWN STRL REUS W/ TWL XL LVL3 (GOWN DISPOSABLE) ×1 IMPLANT
GOWN STRL REUS W/TWL LRG LVL3 (GOWN DISPOSABLE) ×2
GOWN STRL REUS W/TWL XL LVL3 (GOWN DISPOSABLE) ×4
NDL HYPO 25X1 1.5 SAFETY (NEEDLE) ×1 IMPLANT
NEEDLE 27GAX1X1/2 (NEEDLE) IMPLANT
NEEDLE HYPO 25X1 1.5 SAFETY (NEEDLE) ×2 IMPLANT
NS IRRIG 1000ML POUR BTL (IV SOLUTION) ×1 IMPLANT
PACK BASIN DAY SURGERY FS (CUSTOM PROCEDURE TRAY) ×2 IMPLANT
PENCIL BUTTON HOLSTER BLD 10FT (ELECTRODE) ×2 IMPLANT
SHEET MEDIUM DRAPE 40X70 STRL (DRAPES) IMPLANT
SLEEVE SCD COMPRESS KNEE MED (MISCELLANEOUS) ×1 IMPLANT
SPONGE GAUZE 4X4 12PLY STER LF (GAUZE/BANDAGES/DRESSINGS) ×1 IMPLANT
SPONGE LAP 4X18 X RAY DECT (DISPOSABLE) IMPLANT
STRIP CLOSURE SKIN 1/2X4 (GAUZE/BANDAGES/DRESSINGS) IMPLANT
SUT ETHILON 3 0 PS 1 (SUTURE) IMPLANT
SUT ETHILON 5 0 PS 2 18 (SUTURE) IMPLANT
SUT MNCRL AB 4-0 PS2 18 (SUTURE) ×2 IMPLANT
SUT MON AB 4-0 PC3 18 (SUTURE) IMPLANT
SUT SILK 2 0 FS (SUTURE) IMPLANT
SUT VIC AB 3-0 PS1 18 (SUTURE)
SUT VIC AB 3-0 PS1 18XBRD (SUTURE) IMPLANT
SUT VIC AB 3-0 SH 27 (SUTURE)
SUT VIC AB 3-0 SH 27X BRD (SUTURE) IMPLANT
SUT VIC AB 4-0 SH 18 (SUTURE) ×1 IMPLANT
SUT VIC AB 5-0 PS2 18 (SUTURE) IMPLANT
SYR BULB 3OZ (MISCELLANEOUS) IMPLANT
SYR CONTROL 10ML LL (SYRINGE) ×2 IMPLANT
TOWEL OR 17X24 6PK STRL BLUE (TOWEL DISPOSABLE) ×3 IMPLANT
TOWEL OR NON WOVEN STRL DISP B (DISPOSABLE) ×1 IMPLANT
TUBE CONNECTING 20X1/4 (TUBING) ×1 IMPLANT
YANKAUER SUCT BULB TIP NO VENT (SUCTIONS) ×1 IMPLANT

## 2013-11-30 NOTE — Interval H&P Note (Signed)
History and Physical Interval Note:  11/30/2013 7:27 AM  Alfred Martinez  has presented today for surgery, with the diagnosis of sebaceous cyst scalp x2  The various methods of treatment have been discussed with the patient and family. After consideration of risks, benefits and other options for treatment, the patient has consented to  Procedure(s): EXCISION SEBACEOUS CYST SCALP X2 (N/A) as a surgical intervention .  The patient's history has been reviewed, patient re-examined, no change in status, stable for surgery.  I have reviewed the patient's chart and labs.  Questions were answered to the patient's satisfaction.     Liz Malady

## 2013-11-30 NOTE — Anesthesia Preprocedure Evaluation (Addendum)
Anesthesia Evaluation  Patient identified by MRN, date of birth, ID band Patient awake    Reviewed: Allergy & Precautions, H&P , NPO status , Patient's Chart, lab work & pertinent test results  Airway Mallampati: I TM Distance: >3 FB Neck ROM: Full    Dental no notable dental hx. (+) Teeth Intact, Dental Advisory Given   Pulmonary sleep apnea , former smoker,  breath sounds clear to auscultation  Pulmonary exam normal       Cardiovascular negative cardio ROS  Rhythm:Regular Rate:Normal     Neuro/Psych negative neurological ROS     GI/Hepatic negative GI ROS, Neg liver ROS,   Endo/Other  Morbid obesity  Renal/GU negative Renal ROS  negative genitourinary   Musculoskeletal   Abdominal   Peds  (+) ADHD Hematology negative hematology ROS (+)   Anesthesia Other Findings   Reproductive/Obstetrics negative OB ROS                          Anesthesia Physical Anesthesia Plan  ASA: III  Anesthesia Plan: General   Post-op Pain Management:    Induction: Intravenous  Airway Management Planned: Oral ETT  Additional Equipment:   Intra-op Plan:   Post-operative Plan: Extubation in OR  Informed Consent: I have reviewed the patients History and Physical, chart, labs and discussed the procedure including the risks, benefits and alternatives for the proposed anesthesia with the patient or authorized representative who has indicated his/her understanding and acceptance.   Dental advisory given  Plan Discussed with: CRNA  Anesthesia Plan Comments:         Anesthesia Quick Evaluation

## 2013-11-30 NOTE — Anesthesia Postprocedure Evaluation (Signed)
  Anesthesia Post-op Note  Patient: Alfred Martinez  Procedure(s) Performed: Procedure(s) with comments: EXCISION SEBACEOUS CYST SCALP X2 (N/A) - Anterior and posterior scalp  Patient Location: PACU  Anesthesia Type:General  Level of Consciousness: awake and alert   Airway and Oxygen Therapy: Patient Spontanous Breathing  Post-op Pain: mild  Post-op Assessment: Post-op Vital signs reviewed, Patient's Cardiovascular Status Stable and Respiratory Function Stable  Post-op Vital Signs: Reviewed  Filed Vitals:   11/30/13 0945  BP: 141/83  Pulse: 79  Temp:   Resp: 14    Complications: No apparent anesthesia complications

## 2013-11-30 NOTE — Anesthesia Procedure Notes (Addendum)
Procedure Name: Intubation Date/Time: 11/30/2013 7:39 AM Performed by: Daquan Crapps Pre-anesthesia Checklist: Patient identified, Emergency Drugs available, Suction available and Patient being monitored Patient Re-evaluated:Patient Re-evaluated prior to inductionOxygen Delivery Method: Circle System Utilized Preoxygenation: Pre-oxygenation with 100% oxygen Intubation Type: IV induction Ventilation: Mask ventilation without difficulty Laryngoscope Size: Mac and 3 Grade View: Grade I Tube type: Oral Number of attempts: 1 Airway Equipment and Method: stylet and LTA kit utilized Placement Confirmation: ETT inserted through vocal cords under direct vision,  positive ETCO2 and breath sounds checked- equal and bilateral Secured at: 22 cm Tube secured with: Tape Dental Injury: Teeth and Oropharynx as per pre-operative assessment

## 2013-11-30 NOTE — Discharge Instructions (Signed)
Call your surgeon if you experience:   1.  Fever over 101.0. 2.  Inability to urinate. 3.  Nausea and/or vomiting. 4.  Extreme swelling or bruising at the surgical site. 5.  Continued bleeding from the incision. 6.  Increased pain, redness or drainage from the incision. 7.  Problems related to your pain medication.  Post Anesthesia Home Care Instructions  Activity: Get plenty of rest for the remainder of the day. A responsible adult should stay with you for 24 hours following the procedure.  For the next 24 hours, DO NOT: -Drive a car -Operate machinery -Drink alcoholic beverages -Take any medication unless instructed by your physician -Make any legal decisions or sign important papers.  Meals: Start with liquid foods such as gelatin or soup. Progress to regular foods as tolerated. Avoid greasy, spicy, heavy foods. If nausea and/or vomiting occur, drink only clear liquids until the nausea and/or vomiting subsides. Call your physician if vomiting continues.  Special Instructions/Symptoms: Your throat may feel dry or sore from the anesthesia or the breathing tube placed in your throat during surgery. If this causes discomfort, gargle with warm salt water. The discomfort should disappear within 24 hours.   

## 2013-11-30 NOTE — H&P (View-Only) (Signed)
Patient ID: Alfred Martinez, male   DOB: Feb 11, 1986, 28 y.o.   MRN: [id number]  Chief Complaint  Patient presents with  . New Evaluation    seba cyst on top and back of head     HPI Alfred Martinez is a 28 y.o. male.  Chief complaint: Scalp sebaceous cyst HPI Patient was previously seen by Dr. Biagio Quint regarding a sebaceous cyst on his scalp. This is a left side towards the front. He did not schedule surgery but, since that time, has changed his mind and presents for evaluation for removal. It has never become infected. It has never drained. In the interim, he is also found another small cyst in the posterior right scalp which he would like removed. History reviewed. No pertinent past medical history.  Past Surgical History  Procedure Laterality Date  . 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 No pertinent family history.  Social History History  Substance Use Topics  . Smoking status: Former Smoker    Quit date: 11/08/2002  . Smokeless tobacco: Not on file  . Alcohol Use: Yes     Comment: 1-2x week    No Known Allergies  Current Outpatient Prescriptions  Medication Sig Dispense Refill  . amphetamine-dextroamphetamine (ADDERALL) 10 MG tablet       . Cyanocobalamin (B-12) 100 MCG TABS Take 200 capsules by mouth.      . Multiple Vitamins-Minerals (ECHINACEA ACZ PO) Take by mouth.      . vitamin C (ASCORBIC ACID) 500 MG tablet Take 500 mg by mouth.       No current facility-administered medications for this visit.    Review of Systems Review of Systems  Constitutional: Negative for fever, chills and unexpected weight change.  HENT: Negative for congestion, hearing loss, sore throat, trouble swallowing and voice change.   Eyes: Negative for visual disturbance.  Respiratory: Negative for cough and wheezing.   Cardiovascular: Negative for chest pain, palpitations and leg swelling.  Gastrointestinal: Negative for nausea, vomiting, abdominal pain, diarrhea,  constipation, blood in stool, abdominal distention, anal bleeding and rectal pain.  Genitourinary: Negative for hematuria and difficulty urinating.  Musculoskeletal: Negative for arthralgias.  Skin: Negative for rash and wound.       Please refer to history of present illness  Neurological: Negative for seizures, syncope, weakness and headaches.  Hematological: Negative for adenopathy. Does not bruise/bleed easily.  Psychiatric/Behavioral: Negative for confusion.    There were no vitals taken for this visit.  Physical Exam Physical Exam  Constitutional: He is oriented to person, place, and time. He appears well-developed and well-nourished.  HENT:  Head:    2 cm sebaceous cyst left frontal scalp without evidence of infection, 32mm cyst right occipital scalp without evidence of infection  Eyes: EOM are normal. Pupils are equal, round, and reactive to light.  Neck: Normal range of motion. Neck supple. No tracheal deviation present.  Cardiovascular: Normal rate and normal heart sounds.   Pulmonary/Chest: Effort normal and breath sounds normal. No stridor. No respiratory distress. He has no wheezes.  Abdominal: Soft. Bowel sounds are normal. He exhibits no distension. There is no tenderness. There is no rebound.  Musculoskeletal: Normal range of motion.  Lymphadenopathy:    He has no cervical adenopathy.  Neurological: He is alert and oriented to person, place, and time.  Skin: Skin is warm and dry.     Assessment    Sebaceous cyst of scalp x2    Plan  I have offered removal as an outpatient surgical procedure. We will remove both cysts. Procedure, risks, benefits were discussed in detail the patient. He plans to schedule in the future. He is agreeable.       Liz MaladyBurke E Markos Theil 11/07/2013, 12:21 PM

## 2013-11-30 NOTE — Transfer of Care (Signed)
Immediate Anesthesia Transfer of Care Note  Patient: Alfred Martinez  Procedure(s) Performed: Procedure(s) with comments: EXCISION SEBACEOUS CYST SCALP X2 (N/A) - Anterior and posterior scalp  Patient Location: PACU  Anesthesia Type:General  Level of Consciousness: awake, alert , oriented and patient cooperative  Airway & Oxygen Therapy: Patient Spontanous Breathing and Patient connected to face mask oxygen  Post-op Assessment: Report given to PACU RN and Post -op Vital signs reviewed and stable  Post vital signs: Reviewed and stable  Complications: No apparent anesthesia complications

## 2013-11-30 NOTE — Op Note (Addendum)
11/30/2013  8:40 AM  PATIENT:  Alfred Martinez  28 y.o. male  PRE-OPERATIVE DIAGNOSIS:  sebaceous cyst scalp x2  POST-OPERATIVE DIAGNOSIS:  sebaceous cyst scalp x2  PROCEDURE:  Procedure(s): EXCISION SEBACEOUS CYST SCALP X2 - ANTERIOR SCALP 2CM AND POSTERIOR SCALP 1CM  SURGEON:  Surgeon(s): Liz Malady, MD  ASSISTANTS: none   ANESTHESIA:   local and general  EBL:  Total I/O In: 1000 [I.V.:1000] Out: -   BLOOD ADMINISTERED:none  DRAINS: none   SPECIMEN:  Excision  DISPOSITION OF SPECIMEN:  PATHOLOGY  COUNTS:  YES  DICTATION: .Dragon Dictation  Patient presents for excision of sebaceous cyst scalp x2. He was identified in the preop holding area. His sites were both marked. He received intravenous antibiotics. Informed consent was obtained. He was brought to the operating room and general endotracheal anesthesia was administered by the anesthesia staff. Attention was directed to the anterior scalp cyst first. As was prepped and draped in sterile fashion. We did time out procedure. Local anesthetic was injected. Incision was made over the cyst and subcutaneous tissues were dissected down revealing the cyst wall. This was circumferentially dissected and sent to pathology. The entirety of the cyst was removed. There was good hemostasis.Wound was copiously irrigated and the skin was closed with running 4-0 Monocryl subcuticular followed by Dermabond. Drapes were then removed and the patient was placed in left lateral position. He was appropriately padded. Posterior scalp cyst was prepped and draped in sterile fashion. Incision was made over the cyst. Subcutaneous tissues were dissected down revealing a cyst. It was excised in its entirety and sent to pathology. Hemostasis was obtained with cautery. Local anesthetic was injected and Wound was irrigated.Wound was closed with interrupted subcuticular 4-0 Monocryl. Dermabond was then applied. There was good hemostasis. All counts were  correct.Patient tolerated the procedure well without apparent complication was taken recovery in stable condition.  PATIENT DISPOSITION:  PACU - hemodynamically stable.   Delay start of Pharmacological VTE agent (>24hrs) due to surgical blood loss or risk of bleeding:  no  Violeta Gelinas, MD, MPH, FACS Pager: [phone]  6/5/20158:40 AM

## 2013-12-03 ENCOUNTER — Encounter (HOSPITAL_BASED_OUTPATIENT_CLINIC_OR_DEPARTMENT_OTHER): Payer: Self-pay | Admitting: General Surgery

## 2013-12-19 ENCOUNTER — Encounter (INDEPENDENT_AMBULATORY_CARE_PROVIDER_SITE_OTHER): Payer: BC Managed Care – PPO | Admitting: General Surgery

## 2014-01-02 ENCOUNTER — Ambulatory Visit (INDEPENDENT_AMBULATORY_CARE_PROVIDER_SITE_OTHER): Payer: BC Managed Care – PPO | Admitting: General Surgery

## 2014-01-02 ENCOUNTER — Encounter (INDEPENDENT_AMBULATORY_CARE_PROVIDER_SITE_OTHER): Payer: Self-pay | Admitting: General Surgery

## 2014-01-02 VITALS — BP 138/80 | Resp 18 | Ht 75.0 in | Wt 357.0 lb

## 2014-01-02 DIAGNOSIS — L723 Sebaceous cyst: Secondary | ICD-10-CM

## 2014-01-02 NOTE — Progress Notes (Signed)
Subjective:     Patient ID: Alfred Martinez, male   DOB: 06/08/1986, 28 y.o.   MRN: [id number]  HPI Patient present status post excision of 2 cysts from his scalp. He is doing much better postoperatively.  Review of Systems     Objective:   Physical Exam Both wounds are healing well without signs of infection. Swelling is much improved.    Assessment:     Through very well status post excision of 2 scalp cysts    Plan:     Pathology was benign and reviewed with him. He will return as needed.

## 2014-10-20 NOTE — Discharge Summary (Signed)
PATIENT NAME:  Alfred Martinez, WOMBLES MR#:  [id number] DATE OF BIRTH:  [birth year]  DATE OF ADMISSION:  12/15/2011 DATE OF DISCHARGE:  12/16/2011  PRIMARY CARE PHYSICIAN:  Dr. Jani Gravel, [facility].   REASON FOR ADMISSION: Chest pain.   DISCHARGE DIAGNOSES:  1. Chest pain with elevated cardiac enzymes with cardiac catheterization negative for coronary artery disease symptoms and elevated cardiac enzymes, felt to be secondary to possible myocarditis.  2. Acute renal failure felt to be of prerenal etiology from volume depletion caused by diarrhea and decreased oral intake of fluids with resolution of acute renal failure with IV fluids/rehydration.  3. Recent diarrheal illness, possibly virally mediated, now clinically resolved and patient with formed stools at the time of discharge and no longer having diarrhea.  4. History of obesity.   CONSULT: Cardiology, Dr. Clayborn Bigness.   DISCHARGE DISPOSITION: Home.   DISCHARGE MEDICATIONS:  1. Aspirin 81 mg p.o. daily until further advised by Dr. Clayborn Bigness. 2. Tylenol 325 mg 1 to 2 tablets p.o. every 4 to 6 hours p.r.n. pain.  3. Multivitamin 1 tablet p.o. daily.   DISCHARGE CONDITION: Improved, stable. The patient is chest pain free at the time of discharge.   DISCHARGE ACTIVITY: Avoid heavy lifting or exertional activity over the next week and thereafter resume normal activity as tolerated as long as he is not having any symptoms of chest pain or shortness of breath.   DISCHARGE DIET: Regular.   DISCHARGE INSTRUCTIONS:  1. Take medications as prescribed.  2. Return to the Emergency Department for recurrence of symptoms or for development of chest pain or shortness of breath. 3. Return to the Emergency Department for development of fever or chills.   FOLLOWUP INSTRUCTIONS:  1. Follow-up with Dr. Maudie Mercury within one week.  2. Follow-up with Dr. Clayborn Bigness within one to two weeks.   REFERRAL: The patient is being referred to Dr. Clayborn Bigness of  infectious diseases within two weeks to follow-up on testing for brucellosis.   PROCEDURES:  1. Chest x-ray PA and lateral 12/15/2011: No acute cardiopulmonary abnormalities are noted.  2. CT of the chest with PE protocol 12/15/2011 negative for PE. Lung fields are clear. No consolidation, pleural effusion or pneumothorax. No pulmonary edema. Heart is normal in size. No pericardial effusion.  3. Cardiac catheterization 12/15/2011 by Dr. Clayborn Bigness. Normal wall motion, ejection fraction 55%. Normal coronary arteries. Could be possible myocarditis versus pericarditis versus possible coronary vasospasm not noted on cardiac catheterization however. 4. 2D echocardiogram 12/15/2011: LV grossly normal in size, no thrombus. LV systolic function normal at 50 to 55%. Normal LV wall thickness. LV wall motion normal. Right ventricular systolic function is normal.   PERTINENT LABORATORY DATA: CBC normal on admission. Urine drug screen negative on admission. Cardiac enzymes on admission, troponin 2.3, second set troponin 6.6, third set troponin 8.03 with CK 236. CK-MB 22.8, down from CK of 282 and CK-MB of 27.5 on the second set. The fourth set of cardiac enzymes were normal CK 200 and CK-MB 18. Troponin 5.14. Creatinine 1.31 on admission, 1.22 on the day of discharge. Initial EKG was sinus bradycardia, heart rate 54 beats per minute with normal axis, normal intervals, nonspecific T wave abnormality in lead aVF and nonspecific ST changes. Repeat EKG about one hour later revealed normal sinus rhythm, heart rate 78 beats per minute, normal axis, normal intervals. No acute ST or T wave changes. ESR 19 from 12/15/2011.   BRIEF HISTORY AND HOSPITAL COURSE: The patient is a pleasant 29 year old male  with past medical history of obesity who presented to the Emergency Department with complaints of chest pain. Please see dictated admission history and physical for pertinent details surrounding the onset of this hospitalization.  Please see below for further details.   Chest pain in patient who is noted to have elevated cardiac enzymes at the time of admission, initially concerning for possible non-STEMI. He was initially started on aspirin as well as anticoagulation with Lovenox and maintained on oxygen therapy as well and cardiology consultation was urgently obtained while the patient was in the ER. Dr. Clayborn Bigness evaluated the patient and recommended CT of the chest and also cardiac catheterization for further assessment. CT of the chest was negative for PE or pulmonary edema or any evidence of pneumonia and revealed clear lung. Thereafter, the patient went for cardiac catheterization which revealed normal coronary arteries. The patient became chest pain free soon after coming to the ER. Thereafter, he has remained chest pain free. Post cardiac catheterization he has done well and has remained chest pain free. The exact etiology of the patient's elevated cardiac enzymes with chest pain is unclear at this time, but per cardiology could represent possible myocarditis. The patient also reported history of flulike symptoms followed by fevers and later progressing to diarrheal illness prior to hospitalization. It was felt that he may have had viral gastroenteritis and later progressed to possible viral myocarditis. He had evidence of acute renal failure and this was felt to be a prerenal etiology and volume depletion from diarrhea and decreased oral intake of fluids. The patient was maintained on hydration with IV fluids and thereafter his creatinine level normalized and his acute renal failure had resolved. In regards to possible myocarditis, cardiology recommended supportive care for now. The patient has no evidence of congestive heart failure per cardiac catheterization or echocardiogram findings. He was noted to have normal ejection fraction and no pulmonary or peripheral edema. There is no evidence of heart failure currently. There is no  role for diuretics currently. Dr. Clayborn Bigness does not feel ACE inhibitors are required at this time. In the event of possible pericarditis, the patient has been started on aspirin therapy and will continue the same until further advised by Dr. Clayborn Bigness of cardiology and the patient will need close outpatient follow-up. However, there was no significant pericardial effusion noted.   Definitive diagnosis of myocarditis would be myocardial biopsy, however, the patient is not interested in this procedure, per my conversation with him and there is no evidence of heart failure currently as above. 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 Brucellosis leading to myocarditis was felt to be less likely per my conversation with Dr. Clayborn Bigness, however, we sent off BMAT to the [facility] and the patient will follow-up with Dr. Clayborn Bigness in the office in two weeks to obtain the results and for further recommendations. The patient was agreeable to this plan.   On 12/16/2011, the patient was hemodynamically stable and without any chest pain or shortness of breath at rest or with ambulation and was felt to be stable for discharge home with close outpatient follow-up to which the patient was agreeable.   TIME SPENT ON DISCHARGE: Greater than 30 minutes.     ____________________________ Romie Jumper, MD knl:ap D: 12/20/2011 20:10:47 ET T: 12/21/2011 13:34:28 ET JOB#: [id number]  cc: Alfonzo Feller.  Holley Raring, MD, <Dictator> Dwayne D. Clayborn Bigness, MD Heinz Knuckles. Blocker, MD Dr. Jani Gravel, [facility]  Alfonzo Feller Jaleel Allen MD ELECTRONICALLY SIGNED 12/28/2011 1:27

## 2014-10-20 NOTE — H&P (Signed)
PATIENT NAME:  Alfred Martinez, Alfred Martinez MR#:  [id number] DATE OF BIRTH:  07-06-1985  DATE OF ADMISSION:  12/15/2011  REFERRING PHYSICIAN: Graciella Freer, MD  PRIMARY CARE PHYSICIAN: Jani Gravel, MD ([place])  CHIEF COMPLAINT: "I had pain in my chest."   HISTORY OF PRESENT ILLNESS: The patient is a pleasant 29 year old male without significant past medical history other than obesity who presented to the emergency department with the above-mentioned chief complaint. The patient states that he went to work this morning and later while he was at his office he was leaving to go and grab some food and while driving he developed sudden onset chest pain in the substernal portion of his chest. He states prior to this he was not feeling well and had some nausea. Chest pain radiated through his back and was associated with nausea and some diaphoresis, but no shortness of breath. Chest pain also radiated into his lower neck region. Initial chest pain was 10/10 in intensity. It initially started at 7:30 in the morning. He notified his mom and was brought to the ER for further evaluation. Chest pain lasted for approximately three hours total and in the ER it spontaneously resolved on his own without intervention. Also, over the past three days he has had subjective fevers and diarrhea and went to see his primary care physician and states some stool studies were obtained and are still pending at this time and he was placed on empiric Flagyl therapy. His diarrhea is somewhat improved and his stools are more formed now and he has not had any diarrhea today. He does feel dehydrated. He states he has traveled recently to [facility] but has not been out of the country. He denies any sick contact exposure. Otherwise, he is without specific complaints at this time. In the ER, he was noted to have a positive troponin of 2.3 with elevated CK-MB of 10.9 and initial EKG showed mild sinus bradycardia with heart rate 54  beats per minute with nonspecific ST and T wave changes. Repeat EKG a short while later, about an hour later, showed normal sinus rhythm without acute ST or T wave changes with normal heart rate. He is currently chest pain free at the time of my evaluation. Otherwise, he is without specific complaints at this time. Hospitalist services were contacted for further evaluation and for hospital admission.   PAST SURGICAL HISTORY: Left knee meniscus in 2007.    PAST MEDICAL HISTORY: Obesity.  DRUG ALLERGIES: No known drug allergies.   HOME MEDICATIONS: 1. Flagyl, recently started three days ago. He takes 500 mg p.o. every 8 hours. 2. Cipro 500 mg p.o. every 12 hours over the past three days.  3. Adderall 50 mg p.o. daily.   FAMILY HISTORY: Multiple members on the patient's father's side died secondary to myocardial infarction, at least six family members on his paternal side. Mother is alive and healthy and currently present in the ER. Family history is also positive for diabetes mellitus.   SOCIAL HISTORY: Tobacco-none. Alcohol-occasional. He drinks a couple of times a week in the form of beer and liquor. Denies history of alcohol abuse. Illicit drugs-none. The patient lives at home by himself in [place], [place].   REVIEW OF SYSTEMS: CONSTITUTIONAL: Had some subjective fevers earlier in the week, none currently. Denies fatigue or weakness. Has had some intentional weight loss recently and he is on an exercise regimen. Had some pain in his chest earlier which has since resolved. HEAD/EYES: Denies headache  or blurry vision. ENT: Denies tinnitus, earache, nasal discharge, or sore throat. RESPIRATORY: Denies shortness of breath, cough, or wheezing. CARDIOVASCULAR: Had some chest pain earlier which has since resolved. Denies orthopnea or lower extremity. GI: Had some nausea earlier which has since resolved. Denies vomiting. Had some diarrhea earlier, over the past couple of days, which now appears  to be resolving. Denies abdominal pain, melena, or hematochezia. GENITOURINARY: Denies dysuria or hematuria. ENDOCRINE: Denies heat or cold intolerance. HEME/LYMPH: Denies easy bruising or bleeding. INTEGUMENTARY: Denies rash.  MUSCULOSKELETAL: Denies any pain in his joints currently or any muscle weakness. NEURO: Denies headache, numbness, weakness, tingling, or dysarthria. PSYCHIATRIC: Denies depression or anxiety. Denies suicidal or homicidal ideation.   PHYSICAL EXAMINATION:   VITAL SIGNS: Temperature 97.6, pulse 60, respirations 14, and blood pressure 127/83. Repeat blood pressure 116/62. Oxygen saturation 100% on room air.   GENERAL: The patient is alert and oriented and not in acute distress.  HEENT: Normocephalic, atraumatic. Pupils are equally round and reactive to light Extraocular movements are intact. Anicteric sclerae. Conjunctivae pink. Hearing intact to voice. Nares without drainage. Oral mucosa moist without lesions.   NECK: Supple with full range of motion. No jugular venous distention, lymphadenopathy, or carotid bruits bilaterally. No thyromegaly or tenderness to palpation over thyroid gland.   CHEST: Normal respiratory effort without use of accessory respiratory muscles. Lungs are clear to auscultation bilaterally without crackles, rales, or wheezing.   CARDIOVASCULAR: No tenderness to palpation over anterior chest wall.  ABDOMEN: Soft, nontender, and nondistended. Normoactive bowel sounds. No hepatosplenomegaly or palpable masses. No hernias.   EXTREMITIES: No clubbing, cyanosis, or edema. Pedal pulses are palpable bilaterally.   SKIN: No suspicious rashes. Skin turgor is good.   LYMPH: No cervical lymphadenopathy.   NEUROLOGIC: Alert and oriented x3. Cranial nerves II through XII are grossly intact. No focal deficits.   PSYCH: Please male with appropriate affect.   LABS/STUDIES: First EKG reveals sinus bradycardia with heart rate 54 beats per minute with normal axis  and normal intervals. Nonspecific T wave abnormality in lead aVF and also nonspecific ST changes. No priors for comparison.  Repeat EKG about one hour later revealed normal sinus rhythm, heart rate 78 beats per minute with normal axis, normal intervals, and no acute ST or T wave changes.   Urine drug screen negative.   Urinalysis: Hazy urine with specific gravity 1.020, negative nitrite, trace leukocyte esterase, 1 WBC, no bacteria, negative for ketones.   Chest x-ray PA and lateral: No acute cardiopulmonary abnormalities are noted.   CK 143. CK-MB 10.9. Troponin 2.3.   CBC within normal limits except for platelet count slightly low at 146.   CMP within normal limits, except for creatinine of 1.31 and serum albumin slightly low at 3.3.   D-dimer 0.82.  Lipase 97.  INR 0.9.  ASSESSMENT: A 29 year old male with past medical history of obesity and family history of myocardial infarction in members on the patient's father's side here with sudden onset chest pain while driving associated with diaphoresis and nausea and the patient has had diarrhea over the past week as well as fevers.   IMPRESSION:  1. Chest pain with positive troponin, concern for non-STEMI.   2. Acute renal failure.  PLAN: We will admit the patient to telemetry unit for further evaluation and management. We will keep the patient on oxygen, aspirin, and Lovenox therapy for now. Give p.r.n. nitrate if his chest pain recurs. He presented with mild bradycardia so hold off on beta  blocker therapy for now. We will check fasting lipid profile and hemoglobin A1c, especially given family history of diabetes mellitus, but currently he is without hyperglycemia.   Also obtain 2-D echocardiogram. Given mildly elevated d-dimer, we will check CT of the chest to assess for possible underlying PE which may explain elevation of troponin but clinically less likely.   I am not sure if the patient has coronary artery disease as his only risk  factors include obesity and family history. Cardiologist, Dr. Clayborn Bigness, has been notified in the ER and the patient will go for cardiac catheterization later today. In the meanwhile, he will be maintained on oxygen, aspirin, and Lovenox therapy as above.   This could also represent possible myocarditis which may be virally mediated and he did have diarrhea earlier in the week with possible viral illness including flu-like symptoms initially which later progressed to possible viral gastroenteritis (and management of viral myocarditis would be supportive care unless patient has heart failure which doesn't seem to be the case at this time clinically). Another differential diagnosis would include possibe pericarditis but not really suggested by EKG findings.  We will check an ESR. Keep the patient on NSAIDs for now in the event of possible pericarditis. Await further recommendations from cardiology.   In regards to acute renal failure, this could be prerenal and volume depletion caused f range of motion diarrhea. We will keep the patient on IV fluids and follow renal function closely. If renal function worsens or does not improve we will obtain a nephrology consultation for further recommendations and further evaluate.  Deep venous thrombosis prophylaxis with Lovenox.  CODE STATUS: FULL CODE.  TIME SPENT ON ADMISSION: Approximately 50 minutes. ____________________________ Romie Jumper, MD [id number] D: 12/15/2011 11:04:04 ET T: 12/15/2011 11:27:04 ET JOB#: [id number]  cc: Romie Jumper, MD, <Dictator> Jani Gravel, MD - [facility] Alfonzo Feller Wilbur [place] MD ELECTRONICALLY SIGNED 12/28/2011 1:25

## 2014-10-20 NOTE — Consult Note (Signed)
Chief Complaint:   Subjective/Chief Complaint Pt states to be doing well today. He denies cp or angina.   VITAL SIGNS/ANCILLARY NOTES: **Vital Signs.:   20-Jun-13 04:45   Vital Signs Type Routine   Temperature Temperature (F) 97.7   Celsius 36.5   Temperature Source Oral   Pulse Pulse 73   Pulse source per vital sign device   Respirations Respirations 16   Systolic BP Systolic BP 329   Diastolic BP (mmHg) Diastolic BP (mmHg) 79   Mean BP 92   BP Source vital sign device   Pulse Ox % Pulse Ox % 97   Pulse Ox Activity Level  At rest   Oxygen Delivery Room Air/ 21 %    09:57   Vital Signs Type Routine   Temperature Temperature (F) 97.5   Celsius 36.3   Pulse Pulse 72   Respirations Respirations 18   Systolic BP Systolic BP 924   Diastolic BP (mmHg) Diastolic BP (mmHg) 76   Mean BP 91   Pulse Ox % Pulse Ox % 98   Oxygen Delivery Room Air/ 21 %    13:41   Vital Signs Type Routine   Temperature Temperature (F) 97.6   Celsius 36.4   Temperature Source Oral   Pulse Pulse 73   Pulse source per vital sign device   Respirations Respirations 18   Systolic BP Systolic BP 268   Diastolic BP (mmHg) Diastolic BP (mmHg) 83   Mean BP 103   BP Source vital sign device   Pulse Ox % Pulse Ox % 98   Pulse Ox Activity Level  At rest   Oxygen Delivery Room Air/ 21 %  *Intake and Output.:   Shift 20-Jun-13 07:00   Grand Totals Intake:  1235 Output:      Net:  1235 24 Hr.:  3419   IV (Primary)      In:  1235   Length of Stay Totals Intake:  1235 Output:      Net:  1235   Brief Assessment:   Cardiac Regular  murmur present  -- LE edema  -- JVD    Respiratory normal resp effort  clear BS    Gastrointestinal Normal    Gastrointestinal details normal Soft   Lab Results: Routine Chem:  20-Jun-13 02:47    Result Comment troponin - RESULTS VERIFIED BY REPEAT TESTING.  - prev c/ @ 0930 12/15/11 mpg  Result(s) reported on 16 Dec 2011 at 03:31AM.   Glucose, Serum  109   BUN 17    Creatinine (comp) 1.22   Sodium, Serum 141   Potassium, Serum 4.1   Chloride, Serum 105   CO2, Serum 31   Calcium (Total), Serum 8.6   Anion Gap  5   Osmolality (calc) 283   eGFR (African American) >60   eGFR (Non-African American) >60 (eGFR values <44m/min/1.73 m2 may be an indication of chronic kidney disease (CKD). Calculated eGFR is useful in patients with stable renal function. The eGFR calculation will not be reliable in acutely ill patients when serum creatinine is changing rapidly. It is not useful in  patients on dialysis. The eGFR calculation may not be applicable to patients at the low and high extremes of body sizes, pregnant women, and vegetarians.)   Hemoglobin A1c ([facility]) 4.4 (The American Diabetes Association recommends that a primary goal of therapy should be <7% and that physicians should reevaluate the treatment regimen in patients with HbA1c values consistently >8%.)   Cholesterol, Serum 109  Triglycerides, Serum 107   HDL (INHOUSE)  22   VLDL Cholesterol Calculated 21   LDL Cholesterol Calculated 66 (Result(s) reported on 16 Dec 2011 at 03:17AM.)    13:39    Result Comment troponin - RESULTS VERIFIED BY REPEAT TESTING.  - READ-BACK PROCESS PERFORMED.  - c/tammy cobbs 6/20/13at 1455.nbb  Result(s) reported on 16 Dec 2011 at 02:58PM.  Cardiac:  20-Jun-13 02:47    CK, Total  236   CPK-MB, Serum  22.8 (Result(s) reported on 16 Dec 2011 at 03:29AM.)   Troponin I  8.03 (0.00-0.05 0.05 ng/mL or less: NEGATIVE  Repeat testing in 3-6 hrs  if clinically indicated. >0.05 ng/mL: POTENTIAL  MYOCARDIAL INJURY. Repeat  testing in 3-6 hrs if  clinically indicated. NOTE: An increase or decrease  of 30% or more on serial  testing suggests a  clinically important change)    13:39    CK, Total 200   CPK-MB, Serum  18.0 (Result(s) reported on 16 Dec 2011 at 02:53PM.)   Troponin I  5.14 (0.00-0.05 0.05 ng/mL or less: NEGATIVE  Repeat testing in 3-6 hrs  if  clinically indicated. >0.05 ng/mL: POTENTIAL  MYOCARDIAL INJURY. Repeat  testing in 3-6 hrs if  clinically indicated. NOTE: An increase or decrease  of 30% or more on serial  testing suggests a  clinically important change)  Routine Hem:  20-Jun-13 02:47    WBC (CBC) 7.1   RBC (CBC)  4.33   Hemoglobin (CBC)  12.6   Hematocrit (CBC)  38.6   Platelet Count (CBC)  141   MCV 89   MCH 29.2   MCHC 32.8   RDW 12.9   Neutrophil % 51.2   Lymphocyte % 36.6   Monocyte % 9.3   Eosinophil % 2.4   Basophil % 0.5   Neutrophil # 3.6   Lymphocyte # 2.6   Monocyte # 0.7   Eosinophil # 0.2   Basophil # 0.0 (Result(s) reported on 16 Dec 2011 at 03:27AM.)   Radiology Results: XRay:    19-Jun-13 08:51, Chest PA and Lateral   Chest PA and Lateral    REASON FOR EXAM:    Chest Pain  COMMENTS:       PROCEDURE: DXR - DXR CHEST PA (OR AP) AND LATERAL  - Dec 15 2011  8:51AM     RESULT: The lung fields are clear. The heart, mediastinal and osseous   structures show no acute changes.    IMPRESSION:   1.No acute changes are identified.  2. The chest appears mildly hyperinflated, suspicious for a history of   reactive airway disease.      Thank you for the opportunity to contribute to the care of your patient.   Dictation site: 2           Verified By: Dionne Ano WALL, M.D., MD  Cardiac Catherization:    19-Jun-13 11:20, Cardiac Catheterization   Cardiac Catheterization    [facility]  [address] [place], [place] [address]  [phone]     Cardiovascular Catheterization Comprehensive Report     Patient: Alfred Martinez  Study date: 12/15/2011  MR number: [id number]  Account number: [id number]     DOB: 1985/09/01  Age: 29 years  Gender: Male  Race: White  Height: 75.2 in  Weight: 299.4 lb     Diagnostic Cardiologist:  Lujean Amel, MD     SUMMARY:     -SPLANCHNIC AND RENAL VESSELS: The renal arteries were  angiographically normal.     -Summary: Normal  LVF  Normal Wall Motion  EF=55%  Normal Cors  Normal renal shot for Poorly controlled HTN-> Normal  IMP  Possible myocarditis/Pericarditis/Spasm?  Medical therapy     CORONARY CIRCULATION: The coronary circulation is right dominant. Left  main: Normal. LAD: Normal. Circumflex: Normal. RCA: Normal.     ABDOMINAL VESSELS: The renal arteries were angiographically normal.     VENTRICLES: There were no left ventricular global or regional wall  motion abnormalities. The left ventricle was normal in size.     VALVES: AORTIC VALVE: The aortic valve was evaluated by left  ventriculography. The aortic valve appeared to be structurally  normal. The aortic valve leaflets exhibited normal thickness and  normal excursion. There was no aortic stenosis. MITRAL VALVE: The  mitral valve was evaluated by left ventriculography. The mitral valve  appeared grossly normal. The mitral leaflets exhibited normal  thickness and normal excursion. The mitral valve exhibited no  regurgitation.     INDICATIONS: Renal:HTN poorly controlled with medication and azotemia  with ACE inhibitors or A2 blockers.     HISTORY: No history of previous myocardial infarction. There was no  prior diagnosis of congestive heart failure. The patient has  hypertension. There was no history of congestive heart failure,  cardiogenic shock, cerebrovascular disease, peripheral arterial  disease, chronic lung disease, diabetes, cardiomyopathy, or cardiac  arrest. There was no family history of coronary artery disease. PRIOR  CARDIOVASCULAR PROCEDURES: No history of valve surgery, coronary or  graft percutaneous intervention, or coronary bypass surgery.     PROCEDURES PERFORMED: Left heart catheterization with  ventriculography. Left coronary angiography. Right coronary  angiography. Bilateral renal angiography. Procedure: Successful  Closure with Mynx     PROCEDURE: The risks and alternatives of the procedures and  conscious  sedation were explained to the patient and informed consent was  obtained. The patient was brought to the cath lab and placed on the  table. The planned puncture sites were prepped and draped in the  usual sterile fashion.     -Right femoral artery access. The puncture site was infiltrated with  20 ml 1 % lidocaine. The vessel was accessed using the modified  Seldinger technique, a wire was threaded into the vessel, and a 5 Fr  x 11 cm Avanti sheath was advanced over the wire into the vessel.     -Left heart catheterization. A 5 Fr Angled pigtail catheter was  advanced to the ascending aorta. After recording ascending aortic  pressure, the catheter was advanced across the aortic valve and left  ventricular pressure was recorded. Ventriculography was performed  using power injection of contrast agent ( 30 ml) at 10 cc/sec at 600  PSI with a 0.4sec rise time. Imaging was performed using an RAO  projection.     -Left coronary artery angiography. A catheter was advanced to the  aorta and positioned in the vessel ostium under fluoroscopic  guidance. Angiography was performed in multiple projections using  hand-injection of contrast.     -Right coronary artery angiography. A catheter was advanced to the  aorta and positioned in the vessel ostium under fluoroscopic  guidance. Angiography was performed in multiple projections using  hand-injection of contrast.     -Bilateral renal angiography. A catheter was positioned.     -Successful Closure with Mynx.     PROCEDURE COMPLETION: TIMING: Test started at 11:27. Test concluded at  11:52. RADIATION EXPOSURE: Fluoroscopy time: 1.93 min.  MEDICATIONS GIVEN:  Midazolam, 1 mg, IV, at 11:28. Fentanyl, 50 mcg,  IV, at 11:28. Midazolam, 1 mg, IV, at 11:33.  CONTRAST GIVEN: Isovue 110 ml.     Prepared and signed by     Lujean Amel, MD  Signed 12/16/2011 13:45:04     STUDY DIAGRAM     HEMODYNAMIC TABLES     Pressures:   Baseline  Pressures:  - HR: 91  Pressures:  - Rhythm:  Pressures:  -- Aortic Pressure (S/D/M): 105/64/82  Pressures:  -- Left Ventricle (s/edp): 109/18/--     Outputs:  Baseline  Outputs:  -- CALCULATIONS[age]  Outputs:  -- CALCULATIONS: Body Surface Area: 2.61  Outputs:  -- CALCULATIONS: Height in cm: 191.00  Outputs:  -- CALCULATIONS: Sex: Male  Outputs:  -- CALCULATIONS: Weight in kg: 136.10  Cardiology:    19-Jun-13 08:03, ECG   Ventricular Rate 49   Atrial Rate 49   P-R Interval 120   QRS Duration 100   QT 396   QTc 357   P Axis 105   R Axis 1   T Axis 0   ECG interpretation    Marked sinus bradycardia  Abnormal ECG  No previous ECGs available  ----------unconfirmed----------  Confirmed by OVERREAD, NOT ([id number]), editor PEARSON, BARBARA ([id number]) on 12/16/2011 12:49:47 PM   ECG     19-Jun-13 09:40, ECG   Ventricular Rate 79   Atrial Rate 79   P-R Interval 136   QRS Duration 98   QT 356   QTc 408   P Axis 60   R Axis -10   T Axis -2   ECG interpretation    Normal sinus rhythm  Normal ECG  When compared with ECG of 15-Dec-2011 08:03,  Vent. rate has increased BY  30 BPM  ----------unconfirmed----------  Confirmed by OVERREAD, NOT ([id number]), editor PEARSON, BARBARA ([id number]) on 12/16/2011 12:50:04 PM   ECG     19-Jun-13 18:40, Echo Doppler   Echo Doppler    Interpretation Summary    The left ventricle is grossly normal size. There is no thrombus. Left   ventricular systolic function is low normal. Ejection Fraction =   50-55%. There is normal left ventricular wall thickness. The left   ventricular wall motion is normal. The right ventricular systolic   function is normal.    PatientHeight: 191 cm    PatientWeight: 136 kg    BSA: 2.6 m2    Procedure:    A two-dimensional transthoracic echocardiogram with color flow and   Doppler was performed.    The study was completed  bedside.    Left Ventricle    Left ventricular systolic function is low normal.     Ejection Fraction = 50-55%.    The left ventricular wall motion is normal.    The left ventricle is grossly normal size.    There is no thrombus.    There is normal left ventricular wall thickness.    Right Ventricle    The right ventricle is grossly normal size.    There is normal right ventricular wall thickness.    The right ventricular systolic function is normal.    Atria    The left atrium is mildly dilated.    Borderline right atrial enlargement.    Mitral Valve    The mitral valve leaflets appear thickened, but open well.    There is trace mitral regurgitation.    Tricuspid Valve    The tricuspid valve is not well  visualized, but is grossly normal.    There is mild tricuspid regurgitation.    Aortic Valve    The aortic valve is normal in structure and function.    No aortic regurgitation is present.    Pulmonic Valve    The pulmonic valve is not well seen, but is grossly normal.    There is no pulmonic valvular regurgitation.    Great Vessels    The aortic root is not well visualized but is probably normal size.    Pericardium/Pleural    There is no pleural effusion.    No pericardial effusion.    MMode 2D Measurements andCalculations    RVDd: 3.6 cm    IVSd: 1.3 cm    LVIDd: 6.2 cm    LVIDs: 4.6 cm    LVPWd: 1.3 cm    FS: 27 %    EF(Teich): 51 %    Ao root diam: 3.5 cm    ACS: 2.2 cm    LA dimension: 4.7 cm    LVOT diam: 2.3 cm    Doppler Measurements and Calculations    MV E point: 126 cm/sec    MV A point: 85 cm/sec    MV E/A: 1.5     MV P1/2t max vel: 120 cm/sec    MV P1/2t: 58 msec    MVA(P1/2t): 3.8 cm2    MV dec slope: 606 cm/sec2    MV dec time: 0.20 sec    Ao V2 max: 151 cm/sec    Ao max PG: 9.0 mmHg    Ao V2 mean: 91 cm/sec    Ao mean PG: 3.9 mmHg    Ao V2 VTI: 25 cm    AVA(I,D): 3.8 cm2    AVA(V,D): 3.8 cm2    LV max PG: 8.0 mmHg    LV mean PG: 3.0 mmHg    LV V1 max: 138 cm/sec    LV V1 mean: 77 cm/sec    LV V1 VTI: 23 cm     SV(LVOT): 95 ml    PA V40mx: 111 cm/sec    PA max PG: 5.0 mmHg    PA acc time: 0.12 sec    PI end-d vel: 81 cm/sec    TR Max vel: 229 cm/sec    TR Max PG: 21 mmHg    RVSP: 26 mmHg    RAP systole: 5.0 mmHg    PA pr(Accel): 25 mmHg    Reading Physician: CLujean Amel  Sonographer: SJaci Standard Interpreting Physician:  DLujean Amel  electronically signed on   12-16-2011 14:15:24  Requesting Physician: CLujean Amel CT:    19-Jun-13 10:57, CT Chest for Pulm Embolism With Contrast   CT Chest for Pulm Embolism With Contrast    REASON FOR EXAM:    chest pain, elevated D-dimer  COMMENTS:       PROCEDURE: CT  - CT CHEST (FOR PE) W  - Dec 15 2011 10:57AM     RESULT: Indications: Chest Pain    Comparison: None    Technique: A thin-section spiral CT from the lung apices to the upper   abdomen was acquired on a multi slice scanner following 1020mIsovue-370   intravenous contrast. These images were then transferred to the Siemens   work station and were subsequently reviewed utilizing 3-D reconstructions   and MIP images.    Findings:     There is adequate opacification of the central pulmonary arteries. The   segmental and subsegmental branches are suboptimally  opacified limiting   evaluation. There is no central pulmonary embolus. The main pulmonary   artery, right mainpulmonary artery, and left main pulmonary arteries are   normal in size. The heart size is normal. There is no pericardial   effusion.     The lungs are clear. There are areas of air-trapping in the lower lobes   bilaterally. There is no focal consolidation, pleural effusion, or   pneumothorax.     There is no axillary, hilar, or mediastinal adenopathy.    The osseous structures are unremarkable.    The visualized portions of the upper abdomen are unremarkable.    IMPRESSION:      1. There is no central pulmonary embolus. The segmental and subsegmental   branches are suboptimally opacified for  evaluation.    2. There are patchy areas of air trapping in bilateral lower lobes.    Dictation Site: 1          Verified By: Jennette Banker, M.D., MD   Assessment/Plan:  Invasive Device Daily Assessment of Necessity:   Does the patient currently have any of the following indwelling devices? none   Assessment/Plan:   Assessment IMP NQMI SOB Angina Myocarditits Dehydration Obesity Poorly controlled Bp Fx history of CAD    Plan PLAN Tele Enzymes elevated Bp improved Angina resovled Wgt loss  Ok to reume exercise after about one week Stay hydrated Consider ACE if symtoms recur   Electronic Signatures: Lujean Amel D (MD)  (Signed 20-Jun-13 15:32)  Authored: Chief Complaint, VITAL SIGNS/ANCILLARY NOTES, Brief Assessment, Lab Results, Radiology Results, Assessment/Plan   Last Updated: 20-Jun-13 15:32 by Lujean Amel D (MD)

## 2017-01-07 ENCOUNTER — Emergency Department (INDEPENDENT_AMBULATORY_CARE_PROVIDER_SITE_OTHER)
Admission: EM | Admit: 2017-01-07 | Discharge: 2017-01-07 | Disposition: A | Discharge status: Home / Self Care | Payer: 59 | Source: Home / Self Care | Attending: Family Medicine | Admitting: Family Medicine

## 2017-01-07 DIAGNOSIS — S99922A Unspecified injury of left foot, initial encounter: Secondary | ICD-10-CM

## 2017-01-07 MED ORDER — CEPHALEXIN 500 MG PO CAPS: 500.0000 mg | ORAL_CAPSULE | Freq: Three times a day (TID) | ORAL | 0 refills | Status: AC

## 2017-01-07 MED ORDER — BACITRACIN ZINC 500 UNIT/GM EX OINT: 1.0000 "application " | TOPICAL_OINTMENT | Freq: Three times a day (TID) | CUTANEOUS | 0 refills | Status: AC

## 2017-01-07 NOTE — Discharge Instructions (Signed)
°  There is currently a small blood clot under your nail forcing it upward.  It is recommended that you soak your toe in warm soapy water or warm water with Epson salt to help break up the clot.  Then, apply the bacitracin and a bandage to help encourage the nail to go back down to where it use to be.  Avoid poking or pulling at your nail.   If you develop swelling, worsening pain, redness, drainage or pus, or a fever >100.4*F, please have your toe reevaluated by a medical provider.    Please take the Keflex (cephalexin) antibiotic as prescribed to help prevent infection.  Be sure not to walk or soak your feet in standing water such as a puddle, pond, or lake as this will increase risk of infection.   You may take 500mg  acetaminophen every 4-6 hours or in combination with ibuprofen 400-600mg  every 6-8 hours as needed for pain.  You should keep your foot elevated throughout the day and evening to help reduce swelling and pain.

## 2017-01-07 NOTE — ED Triage Notes (Signed)
Pt tripped over a gate and injured left big toe

## 2017-01-07 NOTE — ED Provider Notes (Signed)
CSN: [id number]     Arrival date & time 01/07/17  1943 History   First MD Initiated Contact with Patient 01/07/17 1945     Chief Complaint  Patient presents with  . Toe Injury   (Consider location/radiation/quality/duration/timing/severity/associated sxs/prior Treatment) HPI Alfred Martinez is a 31 y.o. male presenting to UC with c/o Left great toenail pain after stubbing it on a baby gate he has up for his dogs.  He states it forced his nail back but it did not come off completely. There is bleeding but that is mainly controlled PTA with direct pressure. Pain is aching and sore, mild at this time but worse with palpation. No other injuries. Last tetanus was about 6-7 years ago.    Past Medical History:  Diagnosis Date  . ADHD (attention deficit hyperactivity disorder)   . Mosquito bite 11/26/2013   multiple  . Sebaceous cyst 11/2013   x 3 - scalp  . Sleep apnea    no CPAP use   Past Surgical History:  Procedure Laterality Date  . EAR CYST EXCISION N/A 11/30/2013   Procedure: EXCISION SEBACEOUS CYST SCALP X2;  Surgeon: Liz Malady, MD;  Location: [facility];  Service: General;  Laterality: N/A;  Anterior and posterior scalp  . KNEE ARTHROSCOPY Left 2007   History reviewed. No pertinent family history. Social History  Substance Use Topics  . Smoking status: Former Smoker    Quit date: 11/08/2002  . Smokeless tobacco: Current User    Types: Snuff  . Alcohol use Yes     Comment: 2-3 x/week    Review of Systems  Musculoskeletal: Positive for arthralgias and myalgias.  Skin: Positive for color change and wound.    Allergies  Patient has no known allergies.  Home Medications   Prior to Admission medications   Medication Sig Start Date End Date Taking? Authorizing Provider  amphetamine-dextroamphetamine (ADDERALL) 10 MG tablet 30 mg. AM, 20 mg. After lunch 01/25/12   [provider]  bacitracin ointment Apply 1 application topically 3 (three) times  daily. To left great toe for 5-7 days 01/07/17   Lurene Shadow, PA-C  cephALEXin (KEFLEX) 500 MG capsule Take 1 capsule (500 mg total) by mouth 3 (three) times daily. 01/07/17   Lurene Shadow, PA-C  oxyCODONE (ROXICODONE) 5 MG immediate release tablet Take 1 tablet (5 mg total) by mouth every 4 (four) hours as needed (pain). 11/30/13   Violeta Gelinas, MD   Meds Ordered and Administered this Visit  Medications - No data to display  BP 134/77 (BP Location: Left Arm)   Pulse 84   Ht 6\' 3"  (1.905 m)   Wt (!) 360 lb (163.3 kg)   SpO2 98%   BMI 45.00 kg/m  No data found.   Physical Exam  Constitutional: He is oriented to person, place, and time. He appears well-developed and well-nourished. No distress.  HENT:  Head: Normocephalic and atraumatic.  Eyes: EOM are normal.  Neck: Normal range of motion.  Cardiovascular: Normal rate.   Pulmonary/Chest: Effort normal.  Musculoskeletal: Normal range of motion. He exhibits tenderness. He exhibits no edema.  Left great toe: full ROM, non-tender besides nailbed.  Neurological: He is alert and oriented to person, place, and time.  Skin: Skin is warm and dry. He is not diaphoretic.  Left great toenail: nail partially avulsed with blood clot visible under nail. Scant oozing blood. Skin and nail in tact. Nail matrix appears uninjured.  No foreign bodies seen  or palpated.   Psychiatric: He has a normal mood and affect. His behavior is normal.  Nursing note and vitals reviewed.   Urgent Care Course     Procedures (including critical care time)  Labs Review Labs Reviewed - No data to display  Imaging Review No results found.   MDM   1. Injury of toenail of left foot, initial encounter    Pt presenting to UC with injury to Left great toenail. Nail partially avulsed with blood clot visible.   Soaked toe in warm water and hibiclens. Dried.  Bacitracin and pressure bandage applied.  Rx: Keflex and bacitracin.   Home care instructions  provided. Encouraged f/u in 4-5 days if not improving, sooner if signs of infection.     Lurene Shadowhelps, Jerene Yeager O, PA-C 01/07/17 2010

## 2017-01-09 ENCOUNTER — Telehealth: Payer: Self-pay | Admitting: Emergency Medicine

## 2017-01-09 NOTE — Telephone Encounter (Signed)
Spoke with the patient and he states that he is doing well, and healing, no current issues.

## 2018-07-31 ENCOUNTER — Emergency Department (INDEPENDENT_AMBULATORY_CARE_PROVIDER_SITE_OTHER)
Admission: EM | Admit: 2018-07-31 | Discharge: 2018-07-31 | Disposition: A | Discharge status: Home / Self Care | Payer: 59 | Source: Home / Self Care

## 2018-07-31 ENCOUNTER — Other Ambulatory Visit: Payer: Self-pay

## 2018-07-31 ENCOUNTER — Emergency Department (INDEPENDENT_AMBULATORY_CARE_PROVIDER_SITE_OTHER): Payer: 59

## 2018-07-31 ENCOUNTER — Emergency Department
Admission: EM | Admit: 2018-07-31 | Discharge: 2018-07-31 | Discharge status: Absent without leave | Payer: Self-pay | Source: Home / Self Care

## 2018-07-31 DIAGNOSIS — J101 Influenza due to other identified influenza virus with other respiratory manifestations: Secondary | ICD-10-CM | POA: Diagnosis not present

## 2018-07-31 DIAGNOSIS — R509 Fever, unspecified: Secondary | ICD-10-CM

## 2018-07-31 DIAGNOSIS — R05 Cough: Secondary | ICD-10-CM | POA: Diagnosis not present

## 2018-07-31 DIAGNOSIS — R0989 Other specified symptoms and signs involving the circulatory and respiratory systems: Secondary | ICD-10-CM | POA: Diagnosis not present

## 2018-07-31 LAB — POCT URINALYSIS DIP (MANUAL ENTRY)
Bilirubin, UA: NEGATIVE
Blood, UA: NEGATIVE
Glucose, UA: NEGATIVE mg/dL
Leukocytes, UA: NEGATIVE
Nitrite, UA: NEGATIVE
Protein Ur, POC: NEGATIVE mg/dL
Spec Grav, UA: 1.01 (ref 1.010–1.025)
Urobilinogen, UA: 0.2 E.U./dL
pH, UA: 5.5 (ref 5.0–8.0)

## 2018-07-31 LAB — POCT INFLUENZA A/B
Influenza A, POC: POSITIVE — AB
Influenza B, POC: NEGATIVE

## 2018-07-31 MED ORDER — OSELTAMIVIR PHOSPHATE 75 MG PO CAPS: 75.0000 mg | ORAL_CAPSULE | Freq: Two times a day (BID) | ORAL | 0 refills | Status: AC

## 2018-07-31 NOTE — ED Provider Notes (Signed)
[facility]    CSN: [id number] Arrival date & time: 07/31/18  1449     History   Chief Complaint Chief Complaint  Patient presents with  . Cough  . Fever    HPI Alfred Martinez is a 33 y.o. male.   HPI Alfred Martinez is a 33 y.o. male presenting to UC with c/o 3 days of flu-like symptoms including body aches, sweats, chills, dry cough, and body aches. He also reports burning with urination this morning. Denies hx of UTIs or concern for STIs. He did not receive the flu vaccine this season.  He took mucinex and ibuprofen with some relief.  Denies n/v/d. Pt notes [place] last [place].     Past Medical History:  Diagnosis Date  . ADHD (attention deficit hyperactivity disorder)   . Mosquito bite 11/26/2013   multiple  . Sebaceous cyst 11/2013   x 3 - scalp  . Sleep apnea    no CPAP use    Patient Active Problem List   Diagnosis Date Noted  . Sebaceous cyst 11/07/2013    Past Surgical History:  Procedure Laterality Date  . EAR CYST EXCISION N/A 11/30/2013   Procedure: EXCISION SEBACEOUS CYST SCALP X2;  Surgeon: Liz MaladyBurke E Thompson, MD;  Location: [facility];  Service: General;  Laterality: N/A;  Anterior and posterior scalp  . KNEE ARTHROSCOPY Left 2007       Home Medications    Prior to Admission medications   Medication Sig Start Date End Date Taking? Authorizing Provider  amphetamine-dextroamphetamine (ADDERALL) 10 MG tablet 30 mg. AM, 20 mg. After lunch 01/25/12   [provider]  bacitracin ointment Apply 1 application topically 3 (three) times daily. To left great toe for 5-7 days 01/07/17   Lurene ShadowPhelps, Hitoshi Werts O, PA-C  cephALEXin (KEFLEX) 500 MG capsule Take 1 capsule (500 mg total) by mouth 3 (three) times daily. 01/07/17   Lurene ShadowPhelps, Hampton Cost O, PA-C  oseltamivir (TAMIFLU) 75 MG capsule Take 1 capsule (75 mg total) by mouth every 12 (twelve) hours. 07/31/18   Lurene ShadowPhelps, Artha Stavros O, PA-C  oxyCODONE (ROXICODONE) 5 MG immediate release tablet  Take 1 tablet (5 mg total) by mouth every 4 (four) hours as needed (pain). 11/30/13   Violeta Gelinashompson, Burke, MD    Family History History reviewed. No pertinent family history.  Social History Social History   Tobacco Use  . Smoking status: Former Smoker    Last attempt to quit: 11/08/2002    Years since quitting: 15.7  . Smokeless tobacco: Current User    Types: Snuff  Substance Use Topics  . Alcohol use: Yes    Comment: 2-3 x/week  . Drug use: No     Allergies   Patient has no known allergies.   Review of Systems Review of Systems  Constitutional: Positive for chills, diaphoresis, fatigue and fever.  HENT: Positive for congestion (mild). Negative for ear pain, sore throat, trouble swallowing and voice change.   Respiratory: Positive for cough. Negative for shortness of breath.   Cardiovascular: Positive for palpitations. Negative for chest pain.  Gastrointestinal: Negative for abdominal pain, diarrhea, nausea and vomiting.  Musculoskeletal: Negative for arthralgias, back pain and myalgias.  Skin: Negative for rash.     Physical Exam Triage Vital Signs ED Triage Vitals [07/31/18 1514]  Enc Vitals Group     BP 130/82     Pulse Rate (!) 113     Resp 18     Temp 99.4 F (  37.4 C)     Temp Source Oral     SpO2 95 %     Weight (!) 339 lb (153.8 kg)     Height 6\' 3"  (1.905 m)     Head Circumference      Peak Flow      Pain Score 0     Pain Loc      Pain Edu?      Excl. in GC?    No data found.  Updated Vital Signs BP 130/82 (BP Location: Right Arm)   Pulse (!) 113   Temp 99.4 F (37.4 C) (Oral)   Resp 18   Ht 6\' 3"  (1.905 m)   Wt (!) 339 lb (153.8 kg)   SpO2 95%   BMI 42.37 kg/m   Visual Acuity Right Eye Distance:   Left Eye Distance:   Bilateral Distance:    Right Eye Near:   Left Eye Near:    Bilateral Near:     Physical Exam Vitals signs and nursing note reviewed.  Constitutional:      Appearance: Normal appearance. He is well-developed. He is  diaphoretic.  HENT:     Head: Normocephalic and atraumatic.     Right Ear: Tympanic membrane normal.     Left Ear: Tympanic membrane normal.     Nose: Congestion present.     Right Sinus: No maxillary sinus tenderness or frontal sinus tenderness.     Left Sinus: No maxillary sinus tenderness or frontal sinus tenderness.     Mouth/Throat:     Lips: Pink.     Mouth: Mucous membranes are moist.     Pharynx: Oropharynx is clear. Uvula midline. Posterior oropharyngeal erythema present. No pharyngeal swelling, oropharyngeal exudate or uvula swelling.  Neck:     Musculoskeletal: Normal range of motion and neck supple.  Cardiovascular:     Rate and Rhythm: Regular rhythm. Tachycardia present.  Pulmonary:     Effort: Pulmonary effort is normal. No respiratory distress.     Breath sounds: Normal breath sounds. No stridor. No wheezing or rhonchi.  Musculoskeletal: Normal range of motion.  Skin:    General: Skin is warm.  Neurological:     Mental Status: He is alert and oriented to person, place, and time.  Psychiatric:        Behavior: Behavior normal.      UC Treatments / Results  Labs (all labs ordered are listed, but only abnormal results are displayed) Labs Reviewed  POCT URINALYSIS DIP (MANUAL ENTRY) - Abnormal; Notable for the following components:      Result Value   Ketones, POC UA trace (5) (*)    All other components within normal limits  POCT INFLUENZA A/B - Abnormal; Notable for the following components:   Influenza A, POC Positive (*)    All other components within normal limits    EKG None  Radiology Dg Chest 2 View  Result Date: 07/31/2018 CLINICAL DATA:  Cough and congestion.  Sweats and fever. EXAM: CHEST - 2 VIEW COMPARISON:  None. FINDINGS: The heart size and mediastinal contours are within normal limits. Both lungs are clear. The visualized skeletal structures are unremarkable. IMPRESSION: No active cardiopulmonary disease. Electronically Signed   By: Gerome Sam III M.D   On: 07/31/2018 15:51    Procedures Procedures (including critical care time)  Medications Ordered in UC Medications - No data to display  Initial Impression / Assessment and Plan / UC Course  I have reviewed the triage  vital signs and the nursing notes.  Pertinent labs & imaging results that were available during my care of the patient were reviewed by me and considered in my medical decision making (see chart for details).     UA: normal  Flu test: POSITIVE influenza A CXR: no acute findings, including no evidence of pneumonia  Rx: tamiflu Home care info provided  Final Clinical Impressions(s) / UC Diagnoses   Final diagnoses:  Influenza A     Discharge Instructions      You may take 500mg  acetaminophen every 4-6 hours or in combination with ibuprofen 400-600mg  every 6-8 hours as needed for pain, inflammation, and fever.  Be sure to well hydrated with clear liquids and get at least 8 hours of sleep at night, preferably more while sick.   Please follow up with family medicine in 1 week if needed.     ED Prescriptions    Medication Sig Dispense Auth. Provider   oseltamivir (TAMIFLU) 75 MG capsule Take 1 capsule (75 mg total) by mouth every 12 (twelve) hours. 10 capsule Lurene Shadow, PA-C     Controlled Substance Prescriptions [place] Controlled Substance Registry consulted? Not Applicable   Rolla Plate 07/31/18 1610

## 2018-07-31 NOTE — Discharge Instructions (Signed)
  You may take 500mg acetaminophen every 4-6 hours or in combination with ibuprofen 400-600mg every 6-8 hours as needed for pain, inflammation, and fever.  Be sure to well hydrated with clear liquids and get at least 8 hours of sleep at night, preferably more while sick.   Please follow up with family medicine in 1 week if needed.   

## 2018-07-31 NOTE — ED Triage Notes (Signed)
Pt was in [facility] Monday and Tuesday last week.  Friday night/Saturday morning, started with a dry cough.  Felt ok until last night, then fever, chills, this am cough and burning with urination.  No concerns for STD

## 2020-02-24 IMAGING — DX DG CHEST 2V
4 series · 4 of 4 positions shown · non-contrast
Comparison: None.

CLINICAL DATA: Cough and congestion.  Sweats and fever.

EXAM:
CHEST - 2 VIEW

[chest pa (1 of 2)]
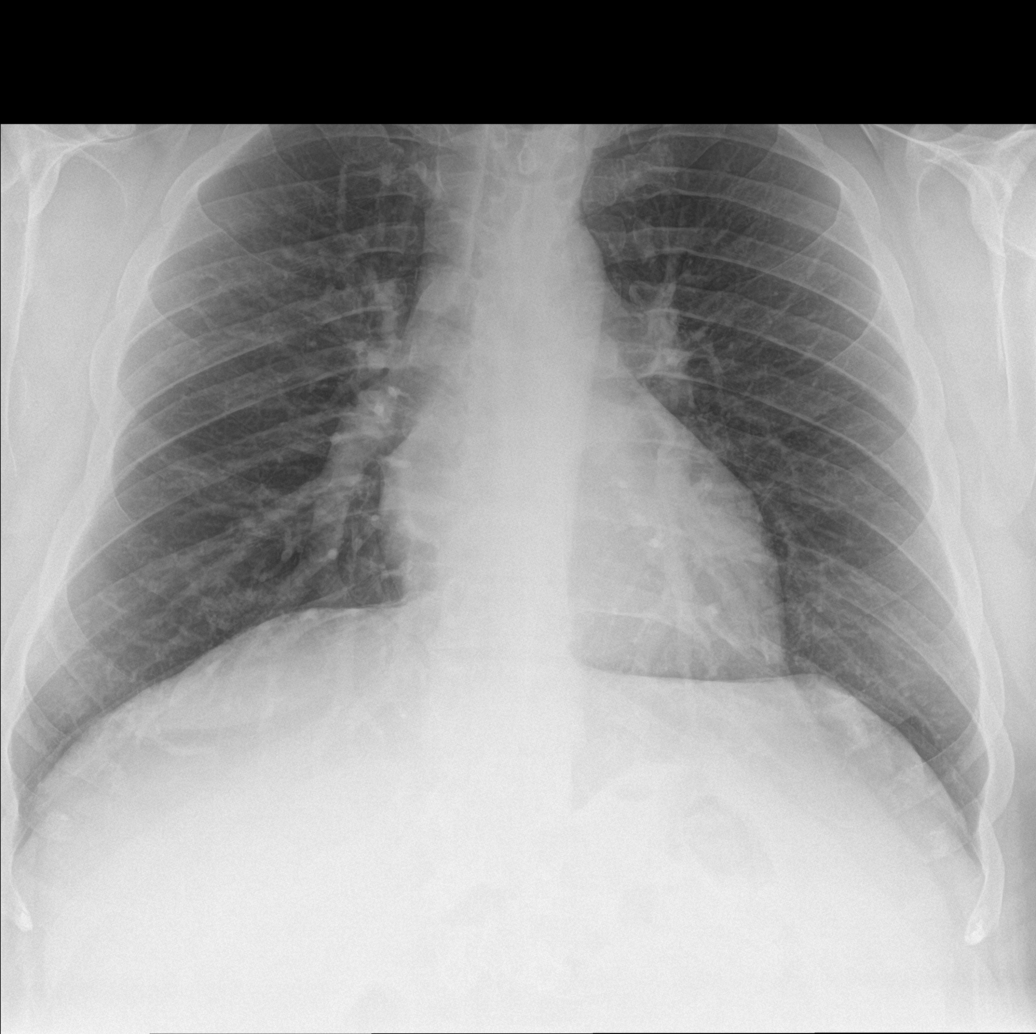

[chest lat (1 of 2)]
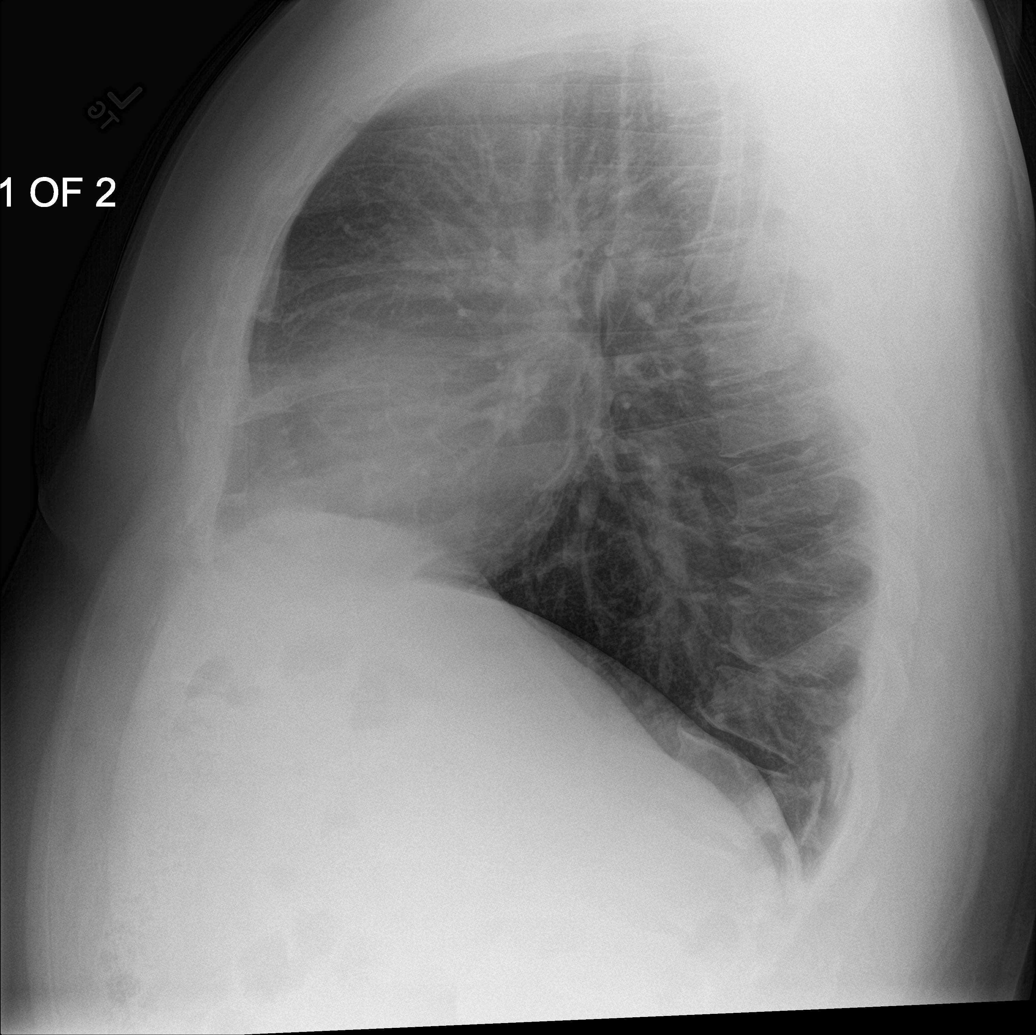

[chest pa (2 of 2)]
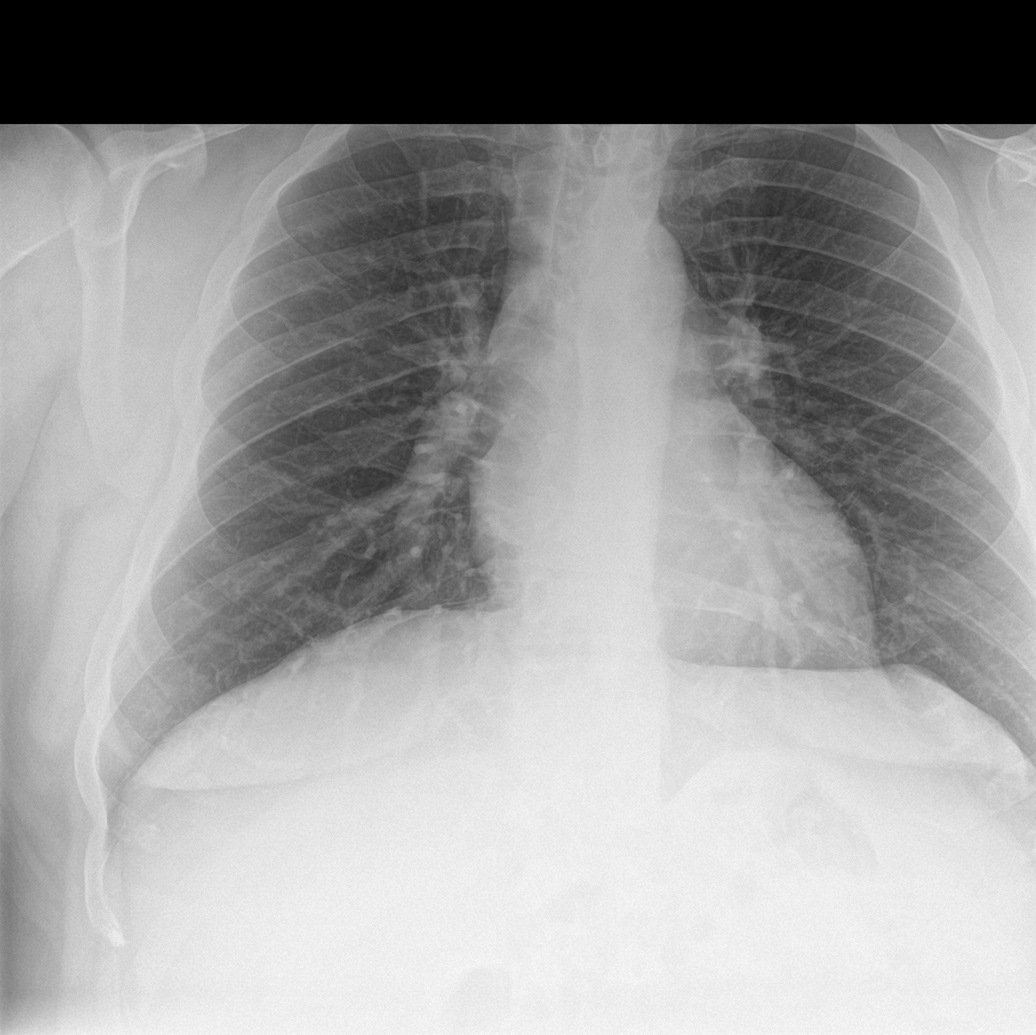

[chest lat (2 of 2)]
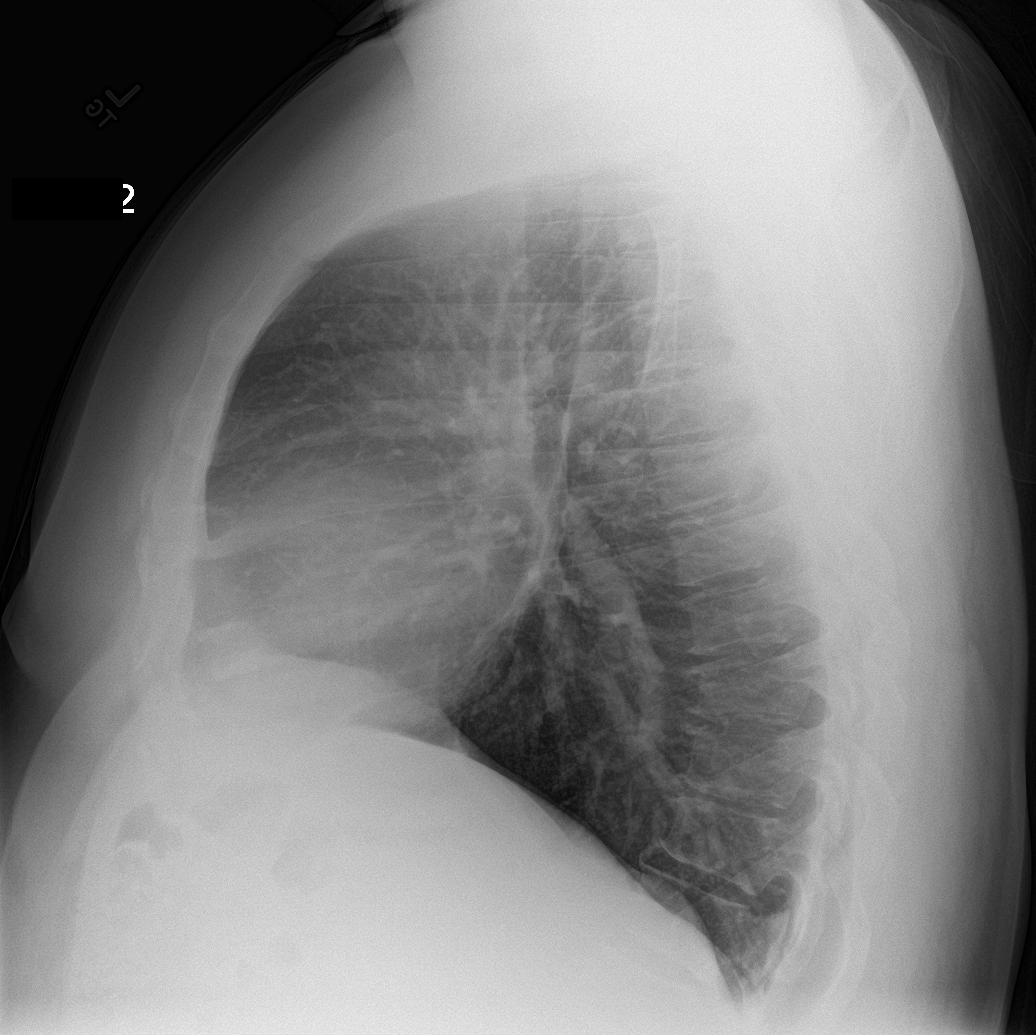

[4 of 4 positions shown; findings below may reference images not displayed]

FINDINGS: The heart size and mediastinal contours are within normal limits.
Both lungs are clear. The visualized skeletal structures are
unremarkable.
IMPRESSION: No active cardiopulmonary disease.
